# Patient Record
Sex: Female | Born: 1970 | Race: White | Hispanic: No | Smoking: Never smoker
Health system: Southern US, Community
[De-identification: ages and names within clinical notes are randomized; demographics above are authoritative.]

## PROBLEM LIST (undated history)

## (undated) DIAGNOSIS — J45909 Unspecified asthma, uncomplicated: Secondary | ICD-10-CM

## (undated) DIAGNOSIS — R112 Nausea with vomiting, unspecified: Secondary | ICD-10-CM

## (undated) DIAGNOSIS — Z9889 Other specified postprocedural states: Secondary | ICD-10-CM

## (undated) DIAGNOSIS — I10 Essential (primary) hypertension: Secondary | ICD-10-CM

## (undated) DIAGNOSIS — T4145XA Adverse effect of unspecified anesthetic, initial encounter: Secondary | ICD-10-CM

## (undated) DIAGNOSIS — J302 Other seasonal allergic rhinitis: Secondary | ICD-10-CM

## (undated) DIAGNOSIS — K219 Gastro-esophageal reflux disease without esophagitis: Secondary | ICD-10-CM

## (undated) DIAGNOSIS — E039 Hypothyroidism, unspecified: Secondary | ICD-10-CM

## (undated) DIAGNOSIS — E079 Disorder of thyroid, unspecified: Secondary | ICD-10-CM

## (undated) DIAGNOSIS — T8859XA Other complications of anesthesia, initial encounter: Secondary | ICD-10-CM

## (undated) HISTORY — DX: Other seasonal allergic rhinitis: J30.2

## (undated) HISTORY — PX: CHOLECYSTECTOMY: SHX55

## (undated) HISTORY — PX: TRANSTHORACIC ECHOCARDIOGRAM: SHX275

## (undated) HISTORY — DX: Unspecified asthma, uncomplicated: J45.909

## (undated) HISTORY — PX: TUBAL LIGATION: SHX77

## (undated) HISTORY — PX: CERVICAL CONE BIOPSY: SUR198

---

## 2000-12-28 ENCOUNTER — Ambulatory Visit (HOSPITAL_COMMUNITY): Admission: RE | Admit: 2000-12-28 | Discharge: 2000-12-28 | Payer: Self-pay | Admitting: Obstetrics and Gynecology

## 2001-07-18 ENCOUNTER — Emergency Department (HOSPITAL_COMMUNITY): Admission: EM | Admit: 2001-07-18 | Discharge: 2001-07-18 | Payer: Self-pay | Admitting: *Deleted

## 2002-01-30 ENCOUNTER — Encounter: Payer: Self-pay | Admitting: *Deleted

## 2002-01-30 ENCOUNTER — Emergency Department (HOSPITAL_COMMUNITY): Admission: EM | Admit: 2002-01-30 | Discharge: 2002-01-30 | Payer: Self-pay | Admitting: *Deleted

## 2003-02-18 ENCOUNTER — Encounter: Payer: Self-pay | Admitting: *Deleted

## 2003-02-18 ENCOUNTER — Emergency Department (HOSPITAL_COMMUNITY): Admission: EM | Admit: 2003-02-18 | Discharge: 2003-02-18 | Payer: Self-pay | Admitting: *Deleted

## 2003-02-18 ENCOUNTER — Encounter: Payer: Self-pay | Admitting: Family Medicine

## 2003-02-18 ENCOUNTER — Ambulatory Visit (HOSPITAL_COMMUNITY): Admission: RE | Admit: 2003-02-18 | Discharge: 2003-02-18 | Payer: Self-pay | Admitting: Family Medicine

## 2003-06-08 ENCOUNTER — Ambulatory Visit (HOSPITAL_COMMUNITY): Admission: RE | Admit: 2003-06-08 | Discharge: 2003-06-08 | Payer: Self-pay | Admitting: Family Medicine

## 2003-06-08 ENCOUNTER — Encounter: Payer: Self-pay | Admitting: Family Medicine

## 2004-08-22 ENCOUNTER — Ambulatory Visit (HOSPITAL_COMMUNITY): Admission: RE | Admit: 2004-08-22 | Discharge: 2004-08-22 | Payer: Self-pay | Admitting: Family Medicine

## 2004-09-22 ENCOUNTER — Emergency Department (HOSPITAL_COMMUNITY): Admission: EM | Admit: 2004-09-22 | Discharge: 2004-09-22 | Payer: Self-pay | Admitting: *Deleted

## 2005-01-10 ENCOUNTER — Other Ambulatory Visit: Admission: RE | Admit: 2005-01-10 | Discharge: 2005-01-10 | Payer: Self-pay | Admitting: Orthopaedic Surgery

## 2006-08-31 ENCOUNTER — Ambulatory Visit (HOSPITAL_COMMUNITY): Admission: RE | Admit: 2006-08-31 | Discharge: 2006-08-31 | Payer: Self-pay | Admitting: Family Medicine

## 2006-09-28 ENCOUNTER — Ambulatory Visit (HOSPITAL_COMMUNITY): Admission: RE | Admit: 2006-09-28 | Discharge: 2006-09-28 | Payer: Self-pay | Admitting: General Surgery

## 2006-09-28 ENCOUNTER — Encounter (INDEPENDENT_AMBULATORY_CARE_PROVIDER_SITE_OTHER): Payer: Self-pay | Admitting: Specialist

## 2008-01-06 ENCOUNTER — Emergency Department (HOSPITAL_COMMUNITY): Admission: EM | Admit: 2008-01-06 | Discharge: 2008-01-06 | Payer: Self-pay | Admitting: Emergency Medicine

## 2008-07-20 ENCOUNTER — Ambulatory Visit (HOSPITAL_COMMUNITY): Admission: RE | Admit: 2008-07-20 | Discharge: 2008-07-20 | Payer: Self-pay | Admitting: Cardiology

## 2008-07-20 ENCOUNTER — Encounter (INDEPENDENT_AMBULATORY_CARE_PROVIDER_SITE_OTHER): Payer: Self-pay | Admitting: Cardiology

## 2008-11-23 ENCOUNTER — Emergency Department (HOSPITAL_COMMUNITY): Admission: EM | Admit: 2008-11-23 | Discharge: 2008-11-23 | Payer: Self-pay | Admitting: Emergency Medicine

## 2010-10-31 ENCOUNTER — Emergency Department (HOSPITAL_COMMUNITY)
Admission: EM | Admit: 2010-10-31 | Discharge: 2010-11-01 | Disposition: A | Discharge status: Home / Self Care | Payer: BC Managed Care – PPO | Attending: Emergency Medicine | Admitting: Emergency Medicine

## 2010-10-31 ENCOUNTER — Emergency Department (HOSPITAL_COMMUNITY): Payer: BC Managed Care – PPO

## 2010-10-31 DIAGNOSIS — R209 Unspecified disturbances of skin sensation: Secondary | ICD-10-CM | POA: Insufficient documentation

## 2010-10-31 DIAGNOSIS — M79609 Pain in unspecified limb: Secondary | ICD-10-CM | POA: Insufficient documentation

## 2010-10-31 DIAGNOSIS — R079 Chest pain, unspecified: Secondary | ICD-10-CM | POA: Insufficient documentation

## 2010-10-31 LAB — CBC
Platelets: 235 10*3/uL (ref 150–400)
RBC: 3.85 MIL/uL — ABNORMAL LOW (ref 3.87–5.11)
RDW: 12.4 % (ref 11.5–15.5)
WBC: 6.8 10*3/uL (ref 4.0–10.5)

## 2010-10-31 LAB — DIFFERENTIAL
Basophils Absolute: 0 10*3/uL (ref 0.0–0.1)
Eosinophils Absolute: 0.3 10*3/uL (ref 0.0–0.7)
Eosinophils Relative: 5 % (ref 0–5)
Neutrophils Relative %: 65 % (ref 43–77)

## 2010-10-31 LAB — POCT CARDIAC MARKERS
CKMB, poc: <1 ng/mL — ABNORMAL LOW (ref 1.0–8.0)
CKMB, poc: <1 ng/mL — ABNORMAL LOW (ref 1.0–8.0)
Myoglobin, poc: 42.6 ng/mL (ref 12–200)
Troponin i, poc: <0.05 ng/mL (ref 0.00–0.09)

## 2010-10-31 LAB — BASIC METABOLIC PANEL
GFR calc Af Amer: >60 mL/min (ref >60)
GFR calc non Af Amer: >60 mL/min (ref >60)
Potassium: 3.4 mEq/L — ABNORMAL LOW (ref 3.5–5.1)
Sodium: 139 mEq/L (ref 135–145)

## 2010-10-31 LAB — D-DIMER, QUANTITATIVE: D-Dimer, Quant: 0.35 ug/mL-FEU (ref 0.00–0.48)

## 2010-12-08 LAB — BASIC METABOLIC PANEL
CO2: 25 mEq/L (ref 19–32)
Calcium: 8.3 mg/dL — ABNORMAL LOW (ref 8.4–10.5)
GFR calc Af Amer: >60 mL/min (ref >60)
GFR calc non Af Amer: >60 mL/min (ref >60)
Sodium: 135 mEq/L (ref 135–145)

## 2010-12-08 LAB — URINALYSIS, ROUTINE W REFLEX MICROSCOPIC
Ketones, ur: 15 mg/dL — AB
Specific Gravity, Urine: >1.03 — ABNORMAL HIGH (ref 1.005–1.030)
Urobilinogen, UA: 2 mg/dL — ABNORMAL HIGH (ref 0.0–1.0)

## 2010-12-08 LAB — URINE MICROSCOPIC-ADD ON

## 2011-01-13 NOTE — H&P (Signed)
NAMEJOHNATHON, MITTAL               ACCOUNT NO.:  [id number]   MEDICAL RECORD NO.:  [id number]          PATIENT TYPE:  AMB   LOCATION:  DAY                           FACILITY:  [facility]   PHYSICIAN:  Dalia Heading, M.D.  DATE OF BIRTH:  1970-11-17   DATE OF ADMISSION:  DATE OF DISCHARGE:  LH                              HISTORY & PHYSICAL   CHIEF COMPLAINT:  Chronic cholecystitis.   HISTORY OF PRESENT ILLNESS:  The patient is a 40 year old white female  who is referred for evaluation and treatment of biliary colic secondary  to chronic cholecystitis.  She has been having right upper quadrant  abdominal pain, nausea, bloating for several weeks.  She does have fatty  food intolerance.  No fever, chills or jaundice have been noted.   PAST MEDICAL HISTORY:  Includes hypothyroidism.   PAST SURGICAL HISTORY:  Tubal ligation, trigger finger release.   CURRENT MEDICATIONS:  Synthroid 75 mcg p.o. daily.   ALLERGIES:  No known drug allergies.   REVIEW OF SYSTEMS:  Noncontributory.   PHYSICAL EXAMINATION:  The patient is a well-developed, well-nourished  white female in no acute distress.  HEENT examination reveals no scleral  icterus.  Lungs are clear to auscultation with equal breath sounds  bilaterally.  Heart examination reveals a regular rate and rhythm  without S3, S4, murmurs.  The abdomen is soft and nondistended.  She is  tender in the right upper quadrant to palpation.  No hepatosplenomegaly,  masses or hernias are identified.  Ultrasound of the gallbladder is  negative.  HIDA scan reveals a normal ejection fraction but reproducible  symptoms with a fatty meal.   IMPRESSION:  Chronic cholecystitis.   PLAN:  The patient is scheduled for laparoscopic cholecystectomy on  September 28, 2006.  The risks and benefits of the procedure including  bleeding, infection, hepatobiliary injury and the possibility of an open  procedure were fully explained to the patient, gave informed  consent.      Dalia Heading, M.D.  Electronically Signed     MAJ/MEDQ  D:  09/25/2006  T:  09/25/2006  Job:  [id number]   cc:   [facility] Short Stay   Kirk Ruths, M.D.  Fax: [phone]

## 2011-01-13 NOTE — Op Note (Signed)
[name], [name]               ACCOUNT NO.:  [id number]   MEDICAL RECORD NO.:  [id number]          PATIENT TYPE:  AMB   LOCATION:  DAY                           FACILITY:  [facility]   PHYSICIAN:  Dalia Heading, M.D.  DATE OF BIRTH:  [birth year]   DATE OF PROCEDURE:  09/28/2006  DATE OF DISCHARGE:                               OPERATIVE REPORT   PREOPERATIVE DIAGNOSIS:  Chronic cholecystitis.   POSTOPERATIVE DIAGNOSIS:  Chronic cholecystitis.   PROCEDURE:  Laparoscopic cholecystectomy.   SURGEON:  Dalia Heading, M.D.   ANESTHESIA:  General endotracheal.   INDICATIONS:  The patient is a 40 year old white female who presents  with biliary colic secondary to chronic cholecystitis.  The risks and  benefits of the procedure, including bleeding, infection, hepatobiliary  injury, and the possibility of an open procedure were fully explained to  the patient, who gave informed consent.   PROCEDURE NOTE:  The patient was placed in the supine position.  After  induction of general endotracheal anesthesia, the abdomen was prepped  and draped in the usual sterile technique with Betadine.  Surgical site  confirmation was performed.   A supraumbilical incision was made down to the fascia.  A Veress needle  was introduced into the abdominal cavity, and confirmation of placement  was done using the saline drop test.  The abdomen was then insufflated  to 16 mmHg pressure.  An 11 mm trocar was introduced into the abdominal  cavity under direct visualization without difficulty.  The patient was  placed in reverse Trendelenburg position.  An additional 11 mm trocar  was placed in the epigastric region, and a 5 mm trocars placed in the  right upper quadrant and right flank regions.  The liver was inspected  and noted to be within normal limits.  The gallbladder was retracted  superiorly and laterally.  The dissection was begun around the  infundibulum of the gallbladder.  The cystic duct was  first identified.  Its juncture to the infundibulum was fully identified.  Endoclips were  placed proximally distally on the cystic duct, and the cystic duct was  divided.  This was likewise done to the cystic artery.  The gallbladder  was then freed away from the gallbladder fossa using Bovie  electrocautery.  The gallbladder was delivered through the epigastric  trocar site using an EndoCatch bag.  The gallbladder fossa was  inspected.  No abnormal bleeding or bile leakage was noted.  Surgicel  was placed in the gallbladder fossa.  All fluid and air were then  evacuate from the abdominal cavity prior to removal of the trocars.   All wounds were irrigated with normal saline.  All wounds were checked  with 0.5 cm Sensorcaine.  The supraumbilical fascia was reapproximated  using an 0 Vicryl interrupted suture.  All skin incisions were closed  using staples.  Betadine ointment and dry sterile dressings were  applied.   All tape and needle counts were correct at the end of the procedure.  The patient was extubated in the operating room and went back to the  recovery room  awake, in stable condition.   COMPLICATIONS:  None.   SPECIMEN:  Gallbladder.   BLOOD LOSS:  Minimal.      Dalia Heading, M.D.  Electronically Signed     MAJ/MEDQ  D:  09/28/2006  T:  09/28/2006  Job:  [id number]   cc:   Kirk Ruths, M.D.  Fax: [phone]

## 2011-03-30 ENCOUNTER — Emergency Department (HOSPITAL_COMMUNITY)
Admission: EM | Admit: 2011-03-30 | Discharge: 2011-03-30 | Discharge status: Against Medical Advice | Payer: BC Managed Care – PPO | Attending: Emergency Medicine | Admitting: Emergency Medicine

## 2011-03-30 DIAGNOSIS — Z5320 Procedure and treatment not carried out because of patient's decision for unspecified reasons: Secondary | ICD-10-CM | POA: Insufficient documentation

## 2011-03-30 NOTE — ED Notes (Signed)
Registration called and reports pt left before being seen

## 2011-05-24 LAB — POCT CARDIAC MARKERS
CKMB, poc: <1 — ABNORMAL LOW
Myoglobin, poc: 39.2

## 2011-12-24 ENCOUNTER — Emergency Department (HOSPITAL_COMMUNITY): Payer: BC Managed Care – PPO

## 2011-12-24 ENCOUNTER — Other Ambulatory Visit: Payer: Self-pay

## 2011-12-24 ENCOUNTER — Emergency Department (HOSPITAL_COMMUNITY)
Admission: EM | Admit: 2011-12-24 | Discharge: 2011-12-24 | Disposition: A | Discharge status: Home / Self Care | Payer: BC Managed Care – PPO | Attending: Emergency Medicine | Admitting: Emergency Medicine

## 2011-12-24 ENCOUNTER — Encounter (HOSPITAL_COMMUNITY): Payer: Self-pay | Admitting: *Deleted

## 2011-12-24 DIAGNOSIS — R079 Chest pain, unspecified: Secondary | ICD-10-CM | POA: Insufficient documentation

## 2011-12-24 DIAGNOSIS — M7989 Other specified soft tissue disorders: Secondary | ICD-10-CM | POA: Insufficient documentation

## 2011-12-24 DIAGNOSIS — E079 Disorder of thyroid, unspecified: Secondary | ICD-10-CM | POA: Insufficient documentation

## 2011-12-24 HISTORY — DX: Disorder of thyroid, unspecified: E07.9

## 2011-12-24 LAB — BASIC METABOLIC PANEL
Calcium: 9.4 mg/dL (ref 8.4–10.5)
GFR calc Af Amer: >90 mL/min (ref >90)
GFR calc non Af Amer: >90 mL/min (ref >90)
Sodium: 136 mEq/L (ref 135–145)

## 2011-12-24 LAB — CBC
MCH: 30.6 pg (ref 26.0–34.0)
MCHC: 34 g/dL (ref 30.0–36.0)
Platelets: 206 10*3/uL (ref 150–400)
RBC: 4.21 MIL/uL (ref 3.87–5.11)

## 2011-12-24 LAB — TROPONIN I: Troponin I: <0.3 ng/mL (ref <0.30)

## 2011-12-24 MED ORDER — METRONIDAZOLE 500 MG PO TABS: 500.0000 mg | ORAL_TABLET | Freq: Two times a day (BID) | ORAL | Status: DC

## 2011-12-24 MED ORDER — POTASSIUM CHLORIDE CRYS ER 20 MEQ PO TBCR: 20.0000 meq | EXTENDED_RELEASE_TABLET | Freq: Every day | ORAL | Status: DC

## 2011-12-24 NOTE — ED Notes (Signed)
Pt reports she returned from the beach this am and has noticed swelling of feet and hands starting yesterday, also reports some tingling in hands

## 2011-12-24 NOTE — ED Provider Notes (Addendum)
History     CSN: [id number]  Arrival date & time 12/24/11  1909   First MD Initiated Contact with Patient 12/24/11 1920      Chief Complaint  Patient presents with  . Joint Swelling    (Consider location/radiation/quality/duration/timing/severity/associated sxs/prior treatment) HPI Tammie Powell is a 41 y.o. female who presents to the Emergency Department complaining of  Swelling to her hands and feet since returning from the beach yesterday. Has taken tylenol only. Here due to urging of family members due to family history of early CAD and stroke. Denies fever, chills, nasuea, vomiting, cough, shortness of breath. Had intermittent chest pain today x 1.  Past Medical History  Diagnosis Date  . Thyroid disease     Past Surgical History  Procedure Date  . Cholecystectomy   . Tubal ligation     No family history on file.  History  Substance Use Topics  . Smoking status: Never Smoker   . Smokeless tobacco: Not on file  . Alcohol Use: No    OB History    Grav Para Term Preterm Abortions TAB SAB Ect Mult Living                  Review of Systems  Constitutional: Negative for fever.       10 Systems reviewed and are negative for acute change except as noted in the HPI.  HENT: Negative for congestion.   Eyes: Negative for discharge and redness.  Respiratory: Negative for cough and shortness of breath.   Cardiovascular: Negative for chest pain.  Gastrointestinal: Negative for vomiting and abdominal pain.  Musculoskeletal: Negative for back pain.       Hands and feet swelling  Skin: Negative for rash.  Neurological: Negative for syncope, numbness and headaches.  Psychiatric/Behavioral:       No behavior change.    Allergies  Codeine  Home Medications   Current Outpatient Rx  Name Route Sig Dispense Refill  . LEVOTHYROXINE SODIUM 88 MCG PO TABS Oral Take 88 mcg by mouth daily.      BP 142/94  Pulse 82  Temp(Src) 97.8 F (36.6 C) (Oral)  Resp 20  Ht 5'  4" (1.626 m)  Wt 215 lb (97.523 kg)  BMI 36.90 kg/m2  SpO2 100%  LMP 11/15/2011  Physical Exam  Nursing note and vitals reviewed. Constitutional:       Awake, alert, nontoxic appearance.  HENT:  Head: Atraumatic.  Eyes: Right eye exhibits no discharge. Left eye exhibits no discharge.  Neck: Neck supple.  Pulmonary/Chest: Effort normal. She exhibits no tenderness.  Abdominal: Soft. There is no tenderness. There is no rebound.  Musculoskeletal: She exhibits edema. She exhibits no tenderness.       Baseline ROM, no obvious new focal weakness. 2+ edema of feet. Swelling of hands without pitting.   Neurological:       Mental status and motor strength appears baseline for patient and situation.  Skin: No rash noted.  Psychiatric: She has a normal mood and affect.    ED Course  Procedures (including critical care time) Results for orders placed during the hospital encounter of 12/24/11  CBC      Component Value Range   WBC 7.4  4.0 - 10.5 (K/uL)   RBC 4.21  3.87 - 5.11 (MIL/uL)   Hemoglobin 12.9  12.0 - 15.0 (g/dL)   HCT 11.9  14.7 - 82.9 (%)   MCV 90.0  78.0 - 100.0 (fL)   MCH 30.6  26.0 - 34.0 (pg)   MCHC 34.0  30.0 - 36.0 (g/dL)   RDW 95.2  84.1 - 32.4 (%)   Platelets 206  150 - 400 (K/uL)  BASIC METABOLIC PANEL      Component Value Range   Sodium 136  135 - 145 (mEq/L)   Potassium 3.6  3.5 - 5.1 (mEq/L)   Chloride 103  96 - 112 (mEq/L)   CO2 27  19 - 32 (mEq/L)   Glucose, Bld 98  70 - 99 (mg/dL)   BUN 10  6 - 23 (mg/dL)   Creatinine, Ser 4.01  0.50 - 1.10 (mg/dL)   Calcium 9.4  8.4 - 02.7 (mg/dL)   GFR calc non Af Amer >90  >90 (mL/min)   GFR calc Af Amer >90  >90 (mL/min)  TROPONIN I      Component Value Range   Troponin I <0.30  <0.30 (ng/mL)  Dg Chest 2 View  12/24/2011  *RADIOLOGY REPORT*  Clinical Data: Chest pain, extremity swelling  CHEST - 2 VIEW  Comparison: 10/31/2010; 01/06/2008  Findings: Grossly unchanged borderline enlarged cardiac silhouette and  mediastinal contours. Grossly unchanged bibasilar opacities favored to represent atelectasis.  No new focal airspace opacities. No definite evidence of pulmonary edema.  No definite pleural effusion or pneumothorax.  Grossly unchanged bones.  IMPRESSION: Grossly unchanged borderline enlarged cardiac silhouette and bibasilar opacities favored to represent atelectasis. No definite evidence of pulmonary edema.  Original Report Authenticated By: Waynard Reeds, M.D.     Date: 12/24/2011  2003  Rate: 75  Rhythm: normal sinus rhythm and sinus arrhythmia  QRS Axis: normal  Intervals: normal  ST/T Wave abnormalities: normal  Conduction Disutrbances:none  Narrative Interpretation:   Old EKG Reviewed: none available    MDM  Patient here with swelling to hands and feet that she noticed when returning from the beach. She is concerned that this is related to her heart since she has a family history of early heart disease and stroke. Labs are normal, including a normal troponin. EKG is normal. Chest x-ray does not reveal any acute process. Patient has remained pain-free since arrival.Dx testing d/w pt .  Questions answered.  Verb understanding, agreeable to d/c home with outpt f/u.Pt stable in ED with no significant deterioration in condition.The patient appears reasonably screened and/or stabilized for discharge and I doubt any other medical condition or other [facility] requiring further screening, evaluation, or treatment in the ED at this time prior to discharge.  MDM Reviewed: nursing note and vitals Interpretation: labs, ECG and x-ray            Nicoletta Dress. Colon Branch, MD 12/24/11 2126  2[address] [facility] in [place] to leave a message regarding two prescriptions sent in error: Metronidazole, and potassium.  Nicoletta Dress. Colon Branch, MD 12/24/11 2131  Nicoletta Dress. Colon Branch, MD 01/16/12 [phone]

## 2011-12-24 NOTE — Discharge Instructions (Signed)
Your blood work here today was normal including her heart numbers. Your EKG was normal and your chest x-ray did not show any acute process. The swelling in your hands and feet will go down if you keep them elevated. Followup with your doctor.

## 2012-07-28 ENCOUNTER — Encounter (HOSPITAL_COMMUNITY): Payer: Self-pay | Admitting: Emergency Medicine

## 2012-07-28 ENCOUNTER — Emergency Department (HOSPITAL_COMMUNITY)
Admission: EM | Admit: 2012-07-28 | Discharge: 2012-07-28 | Disposition: A | Discharge status: Home / Self Care | Payer: BC Managed Care – PPO | Attending: Emergency Medicine | Admitting: Emergency Medicine

## 2012-07-28 ENCOUNTER — Emergency Department (HOSPITAL_COMMUNITY): Payer: BC Managed Care – PPO

## 2012-07-28 DIAGNOSIS — I10 Essential (primary) hypertension: Secondary | ICD-10-CM | POA: Insufficient documentation

## 2012-07-28 DIAGNOSIS — E079 Disorder of thyroid, unspecified: Secondary | ICD-10-CM | POA: Insufficient documentation

## 2012-07-28 DIAGNOSIS — Z79899 Other long term (current) drug therapy: Secondary | ICD-10-CM | POA: Insufficient documentation

## 2012-07-28 DIAGNOSIS — S20219A Contusion of unspecified front wall of thorax, initial encounter: Secondary | ICD-10-CM | POA: Insufficient documentation

## 2012-07-28 HISTORY — DX: Essential (primary) hypertension: I10

## 2012-07-28 MED ORDER — IBUPROFEN 600 MG PO TABS: 600.0000 mg | ORAL_TABLET | Freq: Four times a day (QID) | ORAL | Status: DC | PRN

## 2012-07-28 NOTE — ED Provider Notes (Signed)
Medical screening examination/treatment/procedure(s) were performed by non-physician practitioner and as supervising physician I was immediately available for consultation/collaboration.   Cedric Denison, MD 07/28/12 2349 
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
## 2012-09-01 ENCOUNTER — Encounter (HOSPITAL_COMMUNITY): Payer: Self-pay | Admitting: *Deleted

## 2012-09-01 ENCOUNTER — Emergency Department (HOSPITAL_COMMUNITY)
Admission: EM | Admit: 2012-09-01 | Discharge: 2012-09-01 | Disposition: A | Discharge status: Home / Self Care | Payer: BC Managed Care – PPO | Attending: Emergency Medicine | Admitting: Emergency Medicine

## 2012-09-01 DIAGNOSIS — F411 Generalized anxiety disorder: Secondary | ICD-10-CM | POA: Insufficient documentation

## 2012-09-01 DIAGNOSIS — F419 Anxiety disorder, unspecified: Secondary | ICD-10-CM

## 2012-09-01 DIAGNOSIS — G4700 Insomnia, unspecified: Secondary | ICD-10-CM | POA: Insufficient documentation

## 2012-09-01 DIAGNOSIS — Z79899 Other long term (current) drug therapy: Secondary | ICD-10-CM | POA: Insufficient documentation

## 2012-09-01 DIAGNOSIS — I10 Essential (primary) hypertension: Secondary | ICD-10-CM | POA: Insufficient documentation

## 2012-09-01 DIAGNOSIS — E079 Disorder of thyroid, unspecified: Secondary | ICD-10-CM | POA: Insufficient documentation

## 2012-09-01 MED ORDER — ZOLPIDEM TARTRATE 5 MG PO TABS: 5.0000 mg | ORAL_TABLET | Freq: Every evening | ORAL | Status: DC | PRN

## 2012-09-01 NOTE — ED Notes (Signed)
Patient was not in room for discharge instructions.

## 2012-09-01 NOTE — ED Notes (Signed)
Patient not in room for discharge instructions 

## 2012-09-01 NOTE — ED Notes (Signed)
Pt c/o depression since christmas that has gotten worse today. Pt states she has not slept in 2 days. Pt states she had a sibling that tried to kill herself christmas, has family drama, and her boyfriend moved out this week. Pt states things have become to overwhelming. Pt is tearful. Pt denies si/hi and states she never has but she knows she need some help coping.

## 2012-09-01 NOTE — ED Provider Notes (Signed)
History     CSN: [id number]  Arrival date & time 09/01/12  0350   First MD Initiated Contact with Patient 09/01/12 [phone]      Chief Complaint  Patient presents with  . Depression    (Consider location/radiation/quality/duration/timing/severity/associated sxs/prior treatment) HPI Tammie Powell is a 42 y.o. female who presents to the Emergency Department complaining of depression and insomnia since Christmas. Patient has not slept in two days. She has broken up with a boyfriend and a sibling tried to commit suicide on Christmas. She was at work tonight and became tearful and unable to stop crying. She does not have a psychiatric history and does not see a Veterinary surgeon. She denies SI, HI, AVH.   PCP Dr. Phillips Odor   Past Medical History  Diagnosis Date  . Thyroid disease   . Hypertension     Past Surgical History  Procedure Date  . Cholecystectomy   . Tubal ligation     History reviewed. No pertinent family history.  History  Substance Use Topics  . Smoking status: Never Smoker   . Smokeless tobacco: Not on file  . Alcohol Use: No    OB History    Grav Para Term Preterm Abortions TAB SAB Ect Mult Living                  Review of Systems  Constitutional: Negative for fever.       10 Systems reviewed and are negative for acute change except as noted in the HPI.  HENT: Negative for congestion.   Eyes: Negative for discharge and redness.  Respiratory: Negative for cough and shortness of breath.   Cardiovascular: Negative for chest pain.  Gastrointestinal: Negative for vomiting and abdominal pain.  Musculoskeletal: Negative for back pain.  Skin: Negative for rash.  Neurological: Negative for syncope, numbness and headaches.  Psychiatric/Behavioral:       Insomnia, feels overwhelmed by her family circumstances    Allergies  Codeine  Home Medications   Current Outpatient Rx  Name  Route  Sig  Dispense  Refill  . ALPRAZOLAM 0.5 MG PO TABS   Oral   Take 0.5 mg  by mouth daily as needed.         . IBUPROFEN 600 MG PO TABS   Oral   Take 1 tablet (600 mg total) by mouth every 6 (six) hours as needed for pain.   30 tablet   0   . LEVOTHYROXINE SODIUM 125 MCG PO TABS   Oral   Take 125 mcg by mouth daily.         Marland Kitchen LISINOPRIL 10 MG PO TABS   Oral   Take 10 mg by mouth daily.           BP 142/97  Pulse 85  Temp 97.8 F (36.6 C) (Oral)  Resp 18  Ht 5\' 4"  (1.626 m)  Wt 185 lb (83.915 kg)  BMI 31.76 kg/m2  SpO2 98%  LMP 08/25/2012  Physical Exam  Nursing note and vitals reviewed. Constitutional:       Awake, anxious, tearful  HENT:  Head: Atraumatic.  Eyes: Right eye exhibits no discharge. Left eye exhibits no discharge.  Neck: Neck supple.  Cardiovascular: Normal rate.   Pulmonary/Chest: Effort normal and breath sounds normal. She exhibits no tenderness.  Abdominal: Soft. There is no tenderness. There is no rebound.  Musculoskeletal: She exhibits no tenderness.       Baseline ROM, no obvious new focal weakness.  Neurological:  Mental status and motor strength appears baseline for patient and situation.  Skin: No rash noted.  Psychiatric: She has a normal mood and affect.       Patient is tearful. She denies SI, HI, AVH. She has not slept in two days. She feels anxious and overwhelmed by family drama.    ED Course  Procedures (including critical care time)     MDM  Patient here with reactive depression and insomnia. She denies SI, HI, AVH. She is not psychotic. She is not seeking hospitalization. She is looking for resources information to get help. Provided her with resource information. Pt stable in ED with no significant deterioration in condition.The patient appears reasonably screened and/or stabilized for discharge and I doubt any other medical condition or other [facility] requiring further screening, evaluation, or treatment in the ED at this time prior to discharge.  MDM Reviewed: nursing note and  vitals          Nicoletta Dress. Colon Branch, MD 09/01/12 [phone]

## 2012-09-13 ENCOUNTER — Emergency Department (HOSPITAL_COMMUNITY)
Admission: EM | Admit: 2012-09-13 | Discharge: 2012-09-13 | Disposition: A | Discharge status: Home / Self Care | Payer: BC Managed Care – PPO | Attending: Emergency Medicine | Admitting: Emergency Medicine

## 2012-09-13 ENCOUNTER — Encounter (HOSPITAL_COMMUNITY): Payer: Self-pay | Admitting: *Deleted

## 2012-09-13 DIAGNOSIS — E079 Disorder of thyroid, unspecified: Secondary | ICD-10-CM | POA: Insufficient documentation

## 2012-09-13 DIAGNOSIS — F329 Major depressive disorder, single episode, unspecified: Secondary | ICD-10-CM | POA: Insufficient documentation

## 2012-09-13 DIAGNOSIS — I10 Essential (primary) hypertension: Secondary | ICD-10-CM | POA: Insufficient documentation

## 2012-09-13 DIAGNOSIS — F3289 Other specified depressive episodes: Secondary | ICD-10-CM | POA: Insufficient documentation

## 2012-09-13 LAB — CBC WITH DIFFERENTIAL/PLATELET
Basophils Absolute: 0 10*3/uL (ref 0.0–0.1)
Lymphocytes Relative: 24 % (ref 12–46)
Lymphs Abs: 1.5 10*3/uL (ref 0.7–4.0)
Neutrophils Relative %: 67 % (ref 43–77)
Platelets: 182 10*3/uL (ref 150–400)
RBC: 3.82 MIL/uL — ABNORMAL LOW (ref 3.87–5.11)
RDW: 12.5 % (ref 11.5–15.5)
WBC: 6.3 10*3/uL (ref 4.0–10.5)

## 2012-09-13 LAB — RAPID URINE DRUG SCREEN, HOSP PERFORMED
Barbiturates: NOT DETECTED
Benzodiazepines: POSITIVE — AB
Cocaine: NOT DETECTED
Tetrahydrocannabinol: NOT DETECTED

## 2012-09-13 LAB — COMPREHENSIVE METABOLIC PANEL
ALT: 9 U/L (ref 0–35)
AST: 11 U/L (ref 0–37)
Alkaline Phosphatase: 54 U/L (ref 39–117)
CO2: 29 mEq/L (ref 19–32)
Calcium: 9 mg/dL (ref 8.4–10.5)
GFR calc Af Amer: >90 mL/min (ref >90)
GFR calc non Af Amer: 82 mL/min — ABNORMAL LOW (ref >90)
Glucose, Bld: 93 mg/dL (ref 70–99)
Potassium: 3.6 mEq/L (ref 3.5–5.1)
Sodium: 141 mEq/L (ref 135–145)
Total Protein: 6.2 g/dL (ref 6.0–8.3)

## 2012-09-13 MED ORDER — SERTRALINE HCL 50 MG PO TABS: ORAL_TABLET | ORAL | Status: DC

## 2012-09-13 NOTE — ED Notes (Addendum)
Pt crying in room, states that she came here to get help, pt states that she told the telepsych psychiatrist that and her plan for overdose , EDP made aware

## 2012-09-13 NOTE — ED Notes (Signed)
States she had decided to go home, states her cousin is coming to get her. Advised to seek outpatient services

## 2012-09-13 NOTE — ED Provider Notes (Signed)
History  This chart was scribed for Benny Lennert, MD by Erskine Emery, ED Scribe. This patient was seen in room APA15/APA15 and the patient's care was started at 17:22.   CSN: [id number]  Arrival date & time 09/13/12  1712   First MD Initiated Contact with Patient 09/13/12 1722      Chief Complaint  Patient presents with  . V70.1    (Consider location/radiation/quality/duration/timing/severity/associated sxs/prior Treatment) Tammie Powell is a 42 y.o. female brought in by ambulance, who presents to the Emergency Department complaining of depression for the past several weeks. Pt reports she has wanted to hurt herself lately, but not right now. She claims she has been having a rough couple of weeks; she has been working a lot and she's going through a bad break up with a man who is bipolar. She has been having trouble sleeping, despite taking her Ambien and sometimes struggles with anxiety, despite taking xanax (of which she took 3 today). She was seen here 12 days ago for the same complaint and was discharged home. She has never been seen at [facility] or a similar facility. Patient is a 42 y.o. female presenting with altered mental status. The history is provided by the patient. No language interpreter was used.  Altered Mental Status This is a recurrent problem. The current episode started more than 1 week ago. The problem occurs constantly. The problem has not changed since onset.Pertinent negatives include no chest pain, no abdominal pain, no headaches and no shortness of breath. The symptoms are aggravated by stress. Nothing relieves the symptoms. Treatments tried: [place] and xanax. The treatment provided no relief.  Dr. Lamar Blinks PA is the pt's PCP.  Past Medical History  Diagnosis Date  . Thyroid disease   . Hypertension     Past Surgical History  Procedure Date  . Cholecystectomy   . Tubal ligation     No family history on file.  History  Substance Use Topics  . Smoking  status: Never Smoker   . Smokeless tobacco: Not on file  . Alcohol Use: No    OB History    Grav Para Term Preterm Abortions TAB SAB Ect Mult Living                  Review of Systems  Constitutional: Negative for fatigue.  HENT: Negative for congestion, sinus pressure and ear discharge.   Eyes: Negative for discharge.  Respiratory: Negative for cough and shortness of breath.   Cardiovascular: Negative for chest pain.  Gastrointestinal: Negative for abdominal pain and diarrhea.  Genitourinary: Negative for frequency and hematuria.  Musculoskeletal: Negative for back pain.  Skin: Negative for rash.  Neurological: Negative for seizures and headaches.  Hematological: Negative.   Psychiatric/Behavioral: Positive for suicidal ideas and altered mental status. Negative for hallucinations. The patient is nervous/anxious.   All other systems reviewed and are negative.    Allergies  Codeine  Home Medications   Current Outpatient Rx  Name  Route  Sig  Dispense  Refill  . ALPRAZOLAM 0.5 MG PO TABS   Oral   Take 0.5 mg by mouth daily as needed.         . IBUPROFEN 600 MG PO TABS   Oral   Take 1 tablet (600 mg total) by mouth every 6 (six) hours as needed for pain.   30 tablet   0   . LEVOTHYROXINE SODIUM 125 MCG PO TABS   Oral   Take 125 mcg by mouth  daily.         Marland Kitchen LISINOPRIL 10 MG PO TABS   Oral   Take 10 mg by mouth daily.         Marland Kitchen ZOLPIDEM TARTRATE 5 MG PO TABS   Oral   Take 1 tablet (5 mg total) by mouth at bedtime as needed for sleep.   10 tablet   0     Triage Vitals: BP 153/108  Pulse 69  Temp 97.6 F (36.4 C) (Oral)  Resp 20  SpO2 99%  LMP 08/25/2012  Physical Exam  Nursing note and vitals reviewed. Constitutional: She is oriented to person, place, and time. She appears well-developed.  HENT:  Head: Normocephalic and atraumatic.  Eyes: Conjunctivae normal and EOM are normal. No scleral icterus.  Neck: Neck supple. No thyromegaly present.    Cardiovascular: Normal rate and regular rhythm.  Exam reveals no gallop and no friction rub.   No murmur heard. Pulmonary/Chest: No stridor. She has no wheezes. She has no rales. She exhibits no tenderness.  Abdominal: She exhibits no distension. There is no tenderness. There is no rebound.  Musculoskeletal: Normal range of motion. She exhibits no edema.  Lymphadenopathy:    She has no cervical adenopathy.  Neurological: She is oriented to person, place, and time. Coordination normal.  Skin: No rash noted. No erythema.  Psychiatric: Her behavior is normal. She exhibits a depressed mood. She expresses no suicidal ideation. She expresses no suicidal plans.    ED Course  Procedures (including critical care time) DIAGNOSTIC STUDIES: Oxygen Saturation is 99% on room air, normal by my interpretation.    COORDINATION OF CARE: 17:30--I evaluated the patient and we discussed a treatment plan including urinalysis, blood work, conversation with a psychiatrist, and possible follow up with [facility] to which the pt agreed.   20:40--Psychiatrist faxes recommendation that the pt should be discharged.  21:17--I spoke with the pt again. She is not suicidal, but very depressed. She is trying to decicde whether she wants outpatient or inpatient treatment.  21:35--Pt decided she wants to try outpatient therapy.   Labs Reviewed - No data to display No results found.   No diagnosis found.    MDM        The chart was scribed for me under my direct supervision.  I personally performed the history, physical, and medical decision making and all procedures in the evaluation of this patient.Benny Lennert, MD 09/13/12 2258

## 2012-09-13 NOTE — ED Notes (Signed)
MD at bedside. Zammit back in room with pt

## 2012-09-13 NOTE — ED Notes (Signed)
MD at bedside. 

## 2012-09-13 NOTE — ED Notes (Signed)
Took 3 xanax prior to arrival

## 2012-09-13 NOTE — ED Notes (Signed)
Depressed, wants some help

## 2012-10-28 ENCOUNTER — Other Ambulatory Visit (HOSPITAL_COMMUNITY): Payer: Self-pay | Admitting: Physician Assistant

## 2012-10-28 DIAGNOSIS — M549 Dorsalgia, unspecified: Secondary | ICD-10-CM

## 2012-10-31 ENCOUNTER — Ambulatory Visit (HOSPITAL_COMMUNITY): Admission: RE | Admit: 2012-10-31 | Payer: BC Managed Care – PPO | Source: Ambulatory Visit

## 2012-11-12 ENCOUNTER — Ambulatory Visit (INDEPENDENT_AMBULATORY_CARE_PROVIDER_SITE_OTHER): Payer: BC Managed Care – PPO | Admitting: Internal Medicine

## 2012-11-18 ENCOUNTER — Ambulatory Visit (HOSPITAL_COMMUNITY): Payer: BC Managed Care – PPO

## 2015-12-03 ENCOUNTER — Encounter (HOSPITAL_COMMUNITY): Payer: Self-pay | Admitting: *Deleted

## 2015-12-03 ENCOUNTER — Emergency Department (HOSPITAL_COMMUNITY)
Admission: EM | Admit: 2015-12-03 | Discharge: 2015-12-03 | Disposition: A | Discharge status: Home / Self Care | Payer: BLUE CROSS/BLUE SHIELD | Attending: Emergency Medicine | Admitting: Emergency Medicine

## 2015-12-03 DIAGNOSIS — Z7984 Long term (current) use of oral hypoglycemic drugs: Secondary | ICD-10-CM | POA: Diagnosis not present

## 2015-12-03 DIAGNOSIS — R51 Headache: Secondary | ICD-10-CM | POA: Diagnosis present

## 2015-12-03 DIAGNOSIS — H53149 Visual discomfort, unspecified: Secondary | ICD-10-CM | POA: Diagnosis not present

## 2015-12-03 DIAGNOSIS — R600 Localized edema: Secondary | ICD-10-CM | POA: Insufficient documentation

## 2015-12-03 DIAGNOSIS — I10 Essential (primary) hypertension: Secondary | ICD-10-CM | POA: Diagnosis not present

## 2015-12-03 DIAGNOSIS — R202 Paresthesia of skin: Secondary | ICD-10-CM | POA: Diagnosis not present

## 2015-12-03 DIAGNOSIS — R519 Headache, unspecified: Secondary | ICD-10-CM

## 2015-12-03 LAB — COMPREHENSIVE METABOLIC PANEL
ALT: 19 U/L (ref 14–54)
AST: 20 U/L (ref 15–41)
Albumin: 3.8 g/dL (ref 3.5–5.0)
Alkaline Phosphatase: 60 U/L (ref 38–126)
Anion gap: 7 (ref 5–15)
BILIRUBIN TOTAL: 0.5 mg/dL (ref 0.3–1.2)
BUN: 13 mg/dL (ref 6–20)
CO2: 26 mmol/L (ref 22–32)
CREATININE: 0.79 mg/dL (ref 0.44–1.00)
Calcium: 8.3 mg/dL — ABNORMAL LOW (ref 8.9–10.3)
Chloride: 102 mmol/L (ref 101–111)
Glucose, Bld: 97 mg/dL (ref 65–99)
POTASSIUM: 3.7 mmol/L (ref 3.5–5.1)
Sodium: 135 mmol/L (ref 135–145)
TOTAL PROTEIN: 6.7 g/dL (ref 6.5–8.1)

## 2015-12-03 LAB — CBC WITH DIFFERENTIAL/PLATELET
BASOS ABS: 0 10*3/uL (ref 0.0–0.1)
Basophils Relative: 1 %
Eosinophils Absolute: 0.3 10*3/uL (ref 0.0–0.7)
Eosinophils Relative: 3 %
HEMATOCRIT: 36.8 % (ref 36.0–46.0)
Hemoglobin: 12.6 g/dL (ref 12.0–15.0)
LYMPHS ABS: 1.9 10*3/uL (ref 0.7–4.0)
LYMPHS PCT: 23 %
MCH: 31.5 pg (ref 26.0–34.0)
MCHC: 34.2 g/dL (ref 30.0–36.0)
MCV: 92 fL (ref 78.0–100.0)
MONO ABS: 0.7 10*3/uL (ref 0.1–1.0)
MONOS PCT: 8 %
NEUTROS ABS: 5.6 10*3/uL (ref 1.7–7.7)
Neutrophils Relative %: 65 %
Platelets: 207 10*3/uL (ref 150–400)
RBC: 4 MIL/uL (ref 3.87–5.11)
RDW: 12.1 % (ref 11.5–15.5)
WBC: 8.5 10*3/uL (ref 4.0–10.5)

## 2015-12-03 MED ORDER — METOCLOPRAMIDE HCL 5 MG/ML IJ SOLN
10.0000 mg | Freq: Once | INTRAMUSCULAR | Status: AC
  Administered 2015-12-03: 10 mg via INTRAVENOUS
  Filled 2015-12-03: qty 2

## 2015-12-03 MED ORDER — KETOROLAC TROMETHAMINE 30 MG/ML IJ SOLN
30.0000 mg | Freq: Once | INTRAMUSCULAR | Status: AC
  Administered 2015-12-03: 30 mg via INTRAVENOUS
  Filled 2015-12-03: qty 1

## 2015-12-03 MED ORDER — DIPHENHYDRAMINE HCL 50 MG/ML IJ SOLN
25.0000 mg | Freq: Once | INTRAMUSCULAR | Status: AC
  Administered 2015-12-03: 25 mg via INTRAVENOUS
  Filled 2015-12-03: qty 1

## 2015-12-03 NOTE — ED Notes (Addendum)
Pt states she saw a NP at [facility] about 3 weeks ago  due to having headaches behind her left eye and in the back, left side of her head. Pt also c/o bilateral ankle swelling and numbness and tingling to bilateral hands. Pt had a head ct at [facility] 2 weeks ago and was told that the CT was fine. Pt states her NP at [facility] is supposed to be setting her up an appt with a neurologist but she hasn't heard back from them yet. Pt concerned because her mother had a stroke and has 2 aunts who had aneurysms.

## 2015-12-03 NOTE — Discharge Instructions (Signed)
Follow-up with your neurologist or family doctor

## 2015-12-03 NOTE — ED Provider Notes (Signed)
CSN: [id number]     Arrival date & time 12/03/15  2040 History  By signing my name below, I, [facility], attest that this documentation has been prepared under the direction and in the presence of Milton Ferguson, MD. Electronically Signed: Virgel Bouquet, ED Scribe. 12/03/2015. 9:58 PM.   Chief Complaint  Patient presents with  . Headache   HPI Comments: Tammie Powell is a 45 y.o. female who presents to the Emergency Department complaining of intermittent HAs behind her left eye and left-side of the back of her head. Pt reports that she has was seen at [facility] 3 weeks ago for a similar HA and at [facility] 2 weeks ago where she received a normal head CT scan. She endorses associated photophobia. Pain worse with exposure to light. She has taken indomethacin without relief. Denies any other symptoms currently.   Patient is a 45 y.o. female presenting with headaches. The history is provided by the patient. No language interpreter was used.  Headache Pain location:  L parietal and frontal Quality:  Sharp Radiates to:  Does not radiate Severity currently:  8/10 Severity at highest:  8/10 Onset quality:  Gradual Timing:  Intermittent Progression:  Unchanged Chronicity:  Recurrent Similar to prior headaches: yes   Worsened by:  Light Ineffective treatments:  Prescription medications Associated symptoms: photophobia   Associated symptoms: no abdominal pain, no back pain, no congestion, no cough, no diarrhea, no fatigue, no seizures and no sinus pressure     Past Medical History  Diagnosis Date  . Thyroid disease   . Hypertension    Past Surgical History  Procedure Laterality Date  . Cholecystectomy    . Tubal ligation     No family history on file. Social History  Substance Use Topics  . Smoking status: Never Smoker   . Smokeless tobacco: None  . Alcohol Use: No   OB History    No data available     Review of Systems  Constitutional: Negative for appetite  change and fatigue.  HENT: Negative for congestion, ear discharge and sinus pressure.   Eyes: Positive for photophobia. Negative for discharge.  Respiratory: Negative for cough.   Cardiovascular: Positive for leg swelling (bilateral ankles). Negative for chest pain.  Gastrointestinal: Negative for abdominal pain and diarrhea.  Genitourinary: Negative for frequency and hematuria.  Musculoskeletal: Negative for back pain.  Skin: Negative for rash.  Neurological: Positive for headaches. Negative for seizures.  Psychiatric/Behavioral: Negative for hallucinations.    Allergies  Codeine  Home Medications   Prior to Admission medications   Medication Sig Start Date End Date Taking? Authorizing Provider  levothyroxine (SYNTHROID, LEVOTHROID) 125 MCG tablet Take 125 mcg by mouth every morning.     Historical Provider, MD   BP 174/87 mmHg  Pulse 65  Temp(Src) 98.2 F (36.8 C) (Oral)  Resp 16  Ht 5\' 5"  (1.651 m)  Wt 215 lb (97.523 kg)  BMI 35.78 kg/m2  SpO2 100%  LMP 11/21/2015 Physical Exam  Constitutional: She is oriented to person, place, and time. She appears well-developed.  HENT:  Head: Normocephalic.  Eyes: Conjunctivae and EOM are normal. No scleral icterus.  Neck: Neck supple. No thyromegaly present.  Cardiovascular: Normal rate and regular rhythm.  Exam reveals no gallop and no friction rub.   No murmur heard. Pulmonary/Chest: No stridor. She has no wheezes. She has no rales. She exhibits no tenderness.  Abdominal: She exhibits no distension. There is no tenderness. There is no rebound.  Musculoskeletal:  Normal range of motion. She exhibits edema.  1+ edema in bilateral ankles  Lymphadenopathy:    She has no cervical adenopathy.  Neurological: She is oriented to person, place, and time. She exhibits normal muscle tone. Coordination normal.  Skin: No rash noted. No erythema.  Psychiatric: She has a normal mood and affect. Her behavior is normal.  Nursing note and vitals  reviewed.   ED Course  Procedures   DIAGNOSTIC STUDIES: Oxygen Saturation is 100% on RA, normnal by my interpretation.    COORDINATION OF CARE: 9:20 PM Will order labs, Toradol, Reglan, and Benadryl. Discussed treatment plan with pt at bedside and pt agreed to plan.  Labs Review Labs Reviewed  CBC WITH DIFFERENTIAL/PLATELET  COMPREHENSIVE METABOLIC PANEL    Imaging Review No results found. I have personally reviewed and evaluated these images and lab results as part of my medical decision-making.   EKG Interpretation None      MDM   Final diagnoses:  None    Labs unremarkable. Patient improved with migraine cocktail. She is to follow-up with her family doctor or the neurologist she's been referred to. Patient does not want a prescription medicines.j    The chart was scribed for me under my direct supervision.  I personally performed the history, physical, and medical decision making and all procedures in the evaluation of this patient.Milton Ferguson, MD 12/03/15 [phone]

## 2015-12-03 NOTE — ED Notes (Signed)
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

## 2016-01-12 ENCOUNTER — Encounter: Payer: Self-pay | Admitting: Neurology

## 2016-01-12 ENCOUNTER — Ambulatory Visit (INDEPENDENT_AMBULATORY_CARE_PROVIDER_SITE_OTHER): Payer: BLUE CROSS/BLUE SHIELD | Admitting: Neurology

## 2016-01-12 VITALS — BP 132/96 | HR 78 | Ht 65.0 in | Wt 215.0 lb

## 2016-01-12 DIAGNOSIS — G44039 Episodic paroxysmal hemicrania, not intractable: Secondary | ICD-10-CM | POA: Diagnosis not present

## 2016-01-12 DIAGNOSIS — G5603 Carpal tunnel syndrome, bilateral upper limbs: Secondary | ICD-10-CM

## 2016-01-12 MED ORDER — TOPIRAMATE 50 MG PO TABS: ORAL_TABLET | ORAL | Status: DC

## 2016-01-12 NOTE — Progress Notes (Signed)
NEUROLOGY CONSULTATION NOTE  ACASIA HELM MRN: [id number] DOB: Mar 26, 1971  Referring provider: Dr. Gerarda Fraction Primary care provider: Dr. Hilma Favors  Reason for consult:  headache  HISTORY OF PRESENT ILLNESS: Tammie Powell is a 45 year old right-handed woman with hypertension and thyroid disease who presents for headache.  Labs and CT head imaging from 11/17/15 personally reviewed.  Onset:  3-4 months ago Location:  Left sided (left retro-orbital and left occipital) Quality:  stabbing Intensity:  7/10.  She woke up twice with the headache. Aura:  no Prodrome:  no Associated symptoms:  Left eye lacrimation and change in iris color.  Once in a while, she notes dizziness.  No nausea, photophobia, phonophobia or visual disturbance. Duration:  30 to 60 minutes Frequency:  2 to 3 times a week. Triggers/exacerbating factors:  no Relieving factors:  Laying down, sleep Activity:  Lays down when able  CT of head from 11/17/15 was unremarkable.  It revealed no mass lesion or aneurysm.  CBC and CMP from 12/03/15 were unremarkable.  Past NSAIDS:  Ibuprofen 400mg  Past analgesics:  no Past abortive triptans:  no Past muscle relaxants:  no Past anti-emetic:  no Past anti-anxiolytic:  no Past sleep aide:  no Past antihypertensive medications:  no Past antidepressant medications:  no Past anticonvulsant medications:  no  Current NSAIDS:  Indomethacin as needed Current analgesics:  acetaminophen Current triptans:  no Current anti-emetic:  no Current muscle relaxants:  no Current anti-anxiolytic:  no Current sleep aide:  no Current Antihypertensive medications:  Lisinopril-HCTZ Current Antidepressant medications:  no Current Anticonvulsant medications:  no Current Vitamins/Herbal/Supplements:  no Current Antihistamines/Decongestants:  no Other therapy:  no  Caffeine:  1 Mt. Dew Alcohol:  occasional Smoker:  no Diet:  hydrates Exercise:  yes Depression/stress:  no Sleep hygiene:   Good Personal history of headache:  Had 2 migraines in college Family history of headache:  Sister has migraines.   Family history of other neurologic conditions:  Maternal aunt has aneurysm and seizures.  For the past month, she also reports intermittent bilateral hand numbness.  She is a Designer, fashion/clothing and uses tools often.  She notices the numbness when pumping gas or holding her phone to her ear.  She sometimes wakes up with it and has to shake it out.  She denies weakness.  PAST MEDICAL HISTORY: Past Medical History  Diagnosis Date  . Thyroid disease   . Hypertension     PAST SURGICAL HISTORY: Past Surgical History  Procedure Laterality Date  . Cholecystectomy    . Tubal ligation      MEDICATIONS: Current Outpatient Prescriptions on File Prior to Visit  Medication Sig Dispense Refill  . INDOMETHACIN PO Take 1 capsule by mouth daily as needed (for pain).    Marland Kitchen levothyroxine (SYNTHROID, LEVOTHROID) 125 MCG tablet Take 125 mcg by mouth every morning.     Marland Kitchen lisinopril-hydrochlorothiazide (PRINZIDE,ZESTORETIC) 20-25 MG tablet Take 1 tablet by mouth daily.    . metFORMIN (GLUCOPHAGE) 500 MG tablet Take 500 mg by mouth 2 (two) times daily with a meal.     No current facility-administered medications on file prior to visit.    ALLERGIES: Allergies  Allergen Reactions  . Codeine Hives, Itching and Other (See Comments)    burning    FAMILY HISTORY: Family History  Problem Relation Age of Onset  . Stroke Mother   . Seizures Maternal Aunt   . Aneurysm Maternal Aunt     SOCIAL HISTORY: Social History   Social  History  . Marital Status: Legally Separated    Spouse Name: N/A  . Number of Children: N/A  . Years of Education: N/A   Occupational History  . Not on file.   Social History Main Topics  . Smoking status: Never Smoker   . Smokeless tobacco: Not on file  . Alcohol Use: No  . Drug Use: No  . Sexual Activity: Not on file   Other Topics Concern  . Not on file    Social History Narrative    REVIEW OF SYSTEMS: Constitutional: No fevers, chills, or sweats, no generalized fatigue, change in appetite Eyes: No visual changes, double vision, eye pain Ear, nose and throat: No hearing loss, ear pain, nasal congestion, sore throat Cardiovascular: No chest pain, palpitations Respiratory:  No shortness of breath at rest or with exertion, wheezes GastrointestinaI: No nausea, vomiting, diarrhea, abdominal pain, fecal incontinence Genitourinary:  No dysuria, urinary retention or frequency Musculoskeletal:  No neck pain, back pain Integumentary: No rash, pruritus, skin lesions Neurological: as above Psychiatric: No depression, insomnia, anxiety Endocrine: No palpitations, fatigue, diaphoresis, mood swings, change in appetite, change in weight, increased thirst Hematologic/Lymphatic:  No purpura, petechiae. Allergic/Immunologic: no itchy/runny eyes, nasal congestion, recent allergic reactions, rashes  PHYSICAL EXAM: Filed Vitals:   01/12/16 1054  BP: 132/96  Pulse: 78   General: No acute distress.  Patient appears well-groomed.  Head:  Normocephalic/atraumatic Eyes:  fundi examined but not visualized Neck: supple, no paraspinal tenderness, full range of motion Back: No paraspinal tenderness Heart: regular rate and rhythm Lungs: Clear to auscultation bilaterally. Vascular: No carotid bruits. Neurological Exam: Mental status: alert and oriented to person, place, and time, recent and remote memory intact, fund of knowledge intact, attention and concentration intact, speech fluent and not dysarthric, language intact. Cranial nerves: CN I: not tested CN II: pupils equal, round and reactive to light, visual fields intact CN III, IV, VI:  full range of motion, no nystagmus, no ptosis CN V: facial sensation intact CN VII: upper and lower face symmetric CN VIII: hearing intact CN IX, X: gag intact, uvula midline CN XI: sternocleidomastoid and trapezius  muscles intact CN XII: tongue midline Bulk & Tone: normal, no fasciculations. Motor:  5/5 throughout Sensation: temperature and vibration sensation intact. Deep Tendon Reflexes:  2+ throughout, toes downgoing.  Finger to nose testing:  Without dysmetria.  Heel to shin:  Without dysmetria.  Gait:  Normal station and stride.  Able to turn and tandem walk. Romberg negative.  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  Will initiate topiramate 50mg  at bedtime for 1 week, increasing to 50mg  twice daily.  She will contact us in 4 weeks with update and we can increase dose further if needed. 2.  Indomethacin as needed 3.  Bilateral wrist splints.  If not effective, then will get NCV-EMG 4.  Follow up in 3 to 4 months  Thank you for allowing me to take part in the care of this patient.  Metta Clines, DO  CC:  Redmond School, MD  Sharilyn Sites, MD

## 2016-01-12 NOTE — Patient Instructions (Addendum)
You probably have paroxysmal hemicrania 1. We will start topiramate (Topamax) 50mg  tablets.      Morning Evening Week 1:   none  1 tab Week 2:    1 tab  1 tab  CALL IN 4 WEEKS WITH UPDATE   Possible side effects include: impaired thinking, sedation, paresthesias (numbness and tingling) and weight loss.  It may cause dehydration and there is a small risk for kidney stones, so make sure to stay hydrated with water during the day.  There is also a very small risk for glaucoma, so if you notice any change in your vision while taking this medication, see an ophthalmologist.    2.  Continue indomethacin as needed. 3.  I think the hand numbness is carpal tunnel syndrome.  Go to your local medical supply store and pick up fitted wrist splints to wear at night.  If not improved by next visit, we can order a nerve test. 4.  Follow up in 3 to 4 months.

## 2016-01-12 NOTE — Progress Notes (Signed)
Chart forwarded.  

## 2016-04-14 ENCOUNTER — Ambulatory Visit: Payer: BLUE CROSS/BLUE SHIELD | Admitting: Neurology

## 2016-04-14 DIAGNOSIS — Z029 Encounter for administrative examinations, unspecified: Secondary | ICD-10-CM

## 2016-05-10 ENCOUNTER — Other Ambulatory Visit: Payer: Self-pay

## 2016-05-10 MED ORDER — TOPIRAMATE 50 MG PO TABS: 50.0000 mg | ORAL_TABLET | Freq: Two times a day (BID) | ORAL | 3 refills | Status: DC

## 2016-08-24 ENCOUNTER — Encounter: Payer: Self-pay | Admitting: Neurology

## 2016-08-24 ENCOUNTER — Ambulatory Visit (INDEPENDENT_AMBULATORY_CARE_PROVIDER_SITE_OTHER): Payer: BLUE CROSS/BLUE SHIELD | Admitting: Neurology

## 2016-08-24 VITALS — BP 144/98 | HR 63 | Ht 65.0 in | Wt 225.0 lb

## 2016-08-24 DIAGNOSIS — R200 Anesthesia of skin: Secondary | ICD-10-CM

## 2016-08-24 DIAGNOSIS — G44039 Episodic paroxysmal hemicrania, not intractable: Secondary | ICD-10-CM

## 2016-08-24 DIAGNOSIS — R2681 Unsteadiness on feet: Secondary | ICD-10-CM | POA: Diagnosis not present

## 2016-08-24 MED ORDER — TOPIRAMATE 50 MG PO TABS: ORAL_TABLET | ORAL | 3 refills | Status: DC

## 2016-08-24 NOTE — Patient Instructions (Addendum)
1.  We will increase topiramate 50mg  tablets to 1 tablet in morning and 2 tablets at bedtime.  Contact me in 6 weeks with update and we can adjust dose if needed. 2.  We will check MRI of brain with and without contrast. We have sent a referral to [place] for your MRI and they will call you directly to schedule your appt. They are located at [place]. If you need to contact them directly please call [phone]. 3.  Follow up in 4 months.

## 2016-08-24 NOTE — Progress Notes (Signed)
Note sent to Dr. Hilma Favors.

## 2016-08-24 NOTE — Progress Notes (Signed)
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  UPDATE: Overall, headaches are slightly improved. Intensity:  7/10 in left eye Duration:  30 to 60 minutes Frequency:  2-3 times a week Current NSAIDS:  Indomethacin as needed Current analgesics:  acetaminophen Current triptans:  no Current anti-emetic:  no Current muscle relaxants:  no Current anti-anxiolytic:  no Current sleep aide:  no Current Antihypertensive medications:  Lisinopril-HCTZ Current Antidepressant medications:  no Current Anticonvulsant medications:  topiramate 50mg  twice daily Current Vitamins/Herbal/Supplements:  no Current Antihistamines/Decongestants:  no Other therapy:  no   Since November, she has had two new issues.  She reports intermittent numbness and burning sensation of the left side of her body, including face, arm and leg.  It lasts 10 to 15 minutes and occurs a couple of times a day.  It is not positional and nothing triggers it.  It is not associated with her headaches.  She feels some heaviness in the arm and leg as well.  Around the same time, she reports problems with balance.  It is intermittent.  Sometimes she feels unsteady on her feet.  It is not associated with spinning sensation or abnormal sensation of movement in her head.  She feels fine when sitting.  It occurs daily.  HISTORY: Onset:  Early 2017 Location:  Left sided (left retro-orbital and left occipital) Quality:  stabbing Initial Intensity:  7/10.  She woke up twice with the headache. Aura:  no Prodrome:  no Associated symptoms:  Left eye lacrimation and change in iris color.  Once in a while, she notes dizziness.  No nausea, photophobia, phonophobia or visual disturbance. Initial Duration:  30 to 60 minutes Initial Frequency:  2 to 3 times a  week. Triggers/exacerbating factors:  no Relieving factors:  Laying down, sleep Activity:  Lays down when able   CT of head from 11/17/15 was unremarkable.  It revealed no mass lesion or aneurysm.  CBC and CMP from 12/03/15 were unremarkable.   Past NSAIDS:  Ibuprofen 400mg  Past analgesics:  no Past abortive triptans:  no Past muscle relaxants:  no Past anti-emetic:  no Past anti-anxiolytic:  no Past sleep aide:  no Past antihypertensive medications:  no Past antidepressant medications:  no Past anticonvulsant medications:  no    Caffeine:  1 Mt. Dew Alcohol:  occasional Smoker:  no Diet:  hydrates Exercise:  yes Depression/stress:  no Sleep hygiene:  Good Personal history of headache:  Had 2 migraines in college Family history of headache:  Sister has migraines.   Family history of other neurologic conditions:  Maternal aunt has aneurysm and seizures.  PAST MEDICAL HISTORY: Past Medical History:  Diagnosis Date  . Hypertension   . Thyroid disease     MEDICATIONS: Current Outpatient Prescriptions on File Prior to Visit  Medication Sig Dispense Refill  . levothyroxine (SYNTHROID, LEVOTHROID) 125 MCG tablet Take 125 mcg by mouth every morning.     Marland Kitchen lisinopril-hydrochlorothiazide (PRINZIDE,ZESTORETIC) 20-25 MG tablet Take 1 tablet by mouth daily.    . metFORMIN (GLUCOPHAGE) 500 MG tablet Take 500 mg by mouth 2 (two) times daily with a meal.     No current facility-administered medications on file prior to visit.     ALLERGIES: Allergies  Allergen Reactions  . Codeine Hives, Itching and Other (See Comments)    burning  FAMILY HISTORY: Family History  Problem Relation Age of Onset  . Stroke Mother   . Seizures Maternal Aunt   . Aneurysm Maternal Aunt     SOCIAL HISTORY: Social History   Social History  . Marital status: Legally Separated    Spouse name: N/A  . Number of children: N/A  . Years of education: N/A   Occupational History  . Not on file.    Social History Main Topics  . Smoking status: Never Smoker  . Smokeless tobacco: Not on file  . Alcohol use No  . Drug use: No  . Sexual activity: Not on file   Other Topics Concern  . Not on file   Social History Narrative  . No narrative on file    REVIEW OF SYSTEMS: Constitutional: No fevers, chills, or sweats, no generalized fatigue, change in appetite Eyes: No visual changes, double vision, eye pain Ear, nose and throat: No hearing loss, ear pain, nasal congestion, sore throat Cardiovascular: No chest pain, palpitations Respiratory:  No shortness of breath at rest or with exertion, wheezes GastrointestinaI: No nausea, vomiting, diarrhea, abdominal pain, fecal incontinence Genitourinary:  No dysuria, urinary retention or frequency Musculoskeletal:  No neck pain, back pain Integumentary: No rash, pruritus, skin lesions Neurological: as above Psychiatric: No depression, insomnia, anxiety Endocrine: No palpitations, fatigue, diaphoresis, mood swings, change in appetite, change in weight, increased thirst Hematologic/Lymphatic:  No purpura, petechiae. Allergic/Immunologic: no itchy/runny eyes, nasal congestion, recent allergic reactions, rashes  PHYSICAL EXAM: Vitals:   08/24/16 0741  BP: (!) 144/98  Pulse: 63   General: No acute distress.  Patient appears well-groomed.   Head:  Normocephalic/atraumatic Eyes:  Fundi examined but not visualized Neck: supple, no paraspinal tenderness, full range of motion Heart:  Regular rate and rhythm Lungs:  Clear to auscultation bilaterally Back: No paraspinal tenderness Neurological Exam: alert and oriented to person, place, and time. Attention span and concentration intact, recent and remote memory intact, fund of knowledge intact.  Speech fluent and not dysarthric, language intact.  CN II-XII intact. Bulk and tone normal, muscle strength 5/5 throughout.  Sensation to light touch, temperature and vibration intact.  Deep tendon  reflexes 2+ throughout, toes downgoing.  Finger to nose and heel to shin testing intact.  Gait normal, Romberg negative.  IMPRESSION: Paroxysmal hemicrania New-onset left sided numbness and gait instability, unclear etiology.  Exam is normal.  PLAN: 1.  Will increase topiramate to 50mg  in AM and 100mg  at night.  She will contact me in 6 weeks with update. 2.  Check MRI of brain with and without contrast to evaluate for intracranial etiology of left sided numbness and gait instability. 3.  Follow up in 4 months.   Metta Clines, DO  CC:  Sharilyn Sites, MD

## 2016-09-04 ENCOUNTER — Inpatient Hospital Stay: Admission: RE | Admit: 2016-09-04 | Payer: BLUE CROSS/BLUE SHIELD | Source: Ambulatory Visit

## 2016-09-12 ENCOUNTER — Ambulatory Visit
Admission: RE | Admit: 2016-09-12 | Discharge: 2016-09-12 | Disposition: A | Discharge status: Home / Self Care | Payer: BLUE CROSS/BLUE SHIELD | Source: Ambulatory Visit | Attending: Neurology | Admitting: Neurology

## 2016-09-12 DIAGNOSIS — R2681 Unsteadiness on feet: Secondary | ICD-10-CM

## 2016-09-12 DIAGNOSIS — R200 Anesthesia of skin: Secondary | ICD-10-CM

## 2016-09-12 DIAGNOSIS — G44039 Episodic paroxysmal hemicrania, not intractable: Secondary | ICD-10-CM

## 2016-09-12 MED ORDER — GADOBENATE DIMEGLUMINE 529 MG/ML IV SOLN
20.0000 mL | Freq: Once | INTRAVENOUS | Status: AC | PRN
  Administered 2016-09-12: 20 mL via INTRAVENOUS

## 2016-09-19 ENCOUNTER — Telehealth: Payer: Self-pay

## 2016-09-19 NOTE — Telephone Encounter (Signed)
Called patient.  No answer.

## 2016-09-19 NOTE — Telephone Encounter (Signed)
-----   Message from Pieter Partridge, DO sent at 09/17/2016  9:55 AM EST ----- MRI of brain is normal.  There is no explanation for her intermittent left sided numbness and unsteady gait.

## 2016-09-21 NOTE — Telephone Encounter (Signed)
Spoke to patient. Gave results. Patient verbalized understanding. 

## 2017-05-01 ENCOUNTER — Other Ambulatory Visit: Payer: Self-pay

## 2017-05-01 ENCOUNTER — Encounter (HOSPITAL_COMMUNITY): Payer: Self-pay | Admitting: *Deleted

## 2017-05-01 ENCOUNTER — Emergency Department (HOSPITAL_COMMUNITY)
Admission: EM | Admit: 2017-05-01 | Discharge: 2017-05-02 | Disposition: A | Discharge status: Home / Self Care | Payer: BLUE CROSS/BLUE SHIELD | Attending: Emergency Medicine | Admitting: Emergency Medicine

## 2017-05-01 ENCOUNTER — Emergency Department (HOSPITAL_COMMUNITY): Payer: BLUE CROSS/BLUE SHIELD

## 2017-05-01 DIAGNOSIS — R079 Chest pain, unspecified: Secondary | ICD-10-CM

## 2017-05-01 DIAGNOSIS — Z79899 Other long term (current) drug therapy: Secondary | ICD-10-CM | POA: Insufficient documentation

## 2017-05-01 DIAGNOSIS — R110 Nausea: Secondary | ICD-10-CM | POA: Diagnosis not present

## 2017-05-01 DIAGNOSIS — I10 Essential (primary) hypertension: Secondary | ICD-10-CM | POA: Diagnosis not present

## 2017-05-01 LAB — BASIC METABOLIC PANEL
Anion gap: 6 (ref 5–15)
BUN: 14 mg/dL (ref 6–20)
CHLORIDE: 103 mmol/L (ref 101–111)
CO2: 26 mmol/L (ref 22–32)
Calcium: 8.7 mg/dL — ABNORMAL LOW (ref 8.9–10.3)
Creatinine, Ser: 0.98 mg/dL (ref 0.44–1.00)
GFR calc Af Amer: >60 mL/min (ref >60)
GFR calc non Af Amer: >60 mL/min (ref >60)
GLUCOSE: 99 mg/dL (ref 65–99)
Potassium: 3.9 mmol/L (ref 3.5–5.1)
Sodium: 135 mmol/L (ref 135–145)

## 2017-05-01 LAB — CBC
HEMATOCRIT: 39.1 % (ref 36.0–46.0)
Hemoglobin: 12.9 g/dL (ref 12.0–15.0)
MCH: 31.5 pg (ref 26.0–34.0)
MCHC: 33 g/dL (ref 30.0–36.0)
MCV: 95.4 fL (ref 78.0–100.0)
Platelets: 237 10*3/uL (ref 150–400)
RBC: 4.1 MIL/uL (ref 3.87–5.11)
RDW: 12.4 % (ref 11.5–15.5)
WBC: 8.7 10*3/uL (ref 4.0–10.5)

## 2017-05-01 LAB — TROPONIN I
Troponin I: <0.03 ng/mL (ref <0.03)
Troponin I: <0.03 ng/mL (ref <0.03)

## 2017-05-01 MED ORDER — SODIUM CHLORIDE 0.9 % IV BOLUS (SEPSIS): 1000.0000 mL | Freq: Once | INTRAVENOUS | Status: DC

## 2017-05-01 MED ORDER — ONDANSETRON HCL 4 MG/2ML IJ SOLN: 4.0000 mg | Freq: Once | INTRAMUSCULAR | Status: DC

## 2017-05-01 NOTE — ED Triage Notes (Signed)
Pt comes in with left chest pain starting at 1700 that moves down her left arm. States she has family hx of cardiac problems. Pt tearful in triage.

## 2017-05-02 LAB — TSH: TSH: 2.915 u[IU]/mL (ref 0.350–4.500)

## 2017-05-02 MED ORDER — ONDANSETRON 8 MG PO TBDP
8.0000 mg | ORAL_TABLET | Freq: Once | ORAL | Status: AC
  Administered 2017-05-02: 8 mg via ORAL
  Filled 2017-05-02: qty 1

## 2017-05-02 MED ORDER — ONDANSETRON HCL 4 MG PO TABS: 4.0000 mg | ORAL_TABLET | Freq: Three times a day (TID) | ORAL | 0 refills | Status: DC | PRN

## 2017-05-02 NOTE — ED Provider Notes (Signed)
[facility] DEPT Provider Note   CSN: [id number] Arrival date & time: 05/01/17  1827     History   Chief Complaint Chief Complaint  Patient presents with  . Chest Pain    HPI Tammie Powell is a 46 y.o. female.   Chest Pain   This is a recurrent problem. The current episode started 3 to 5 hours ago. The problem occurs constantly. The problem has been gradually improving. The pain is present in the substernal region. The pain is at a severity of 5/10. The pain is moderate. The quality of the pain is described as brief and vice-like. The pain does not radiate. Associated symptoms include back pain and nausea. She has tried nothing for the symptoms. Risk factors include lack of exercise and obesity.    Past Medical History:  Diagnosis Date  . Hypertension   . Thyroid disease     There are no active problems to display for this patient.   Past Surgical History:  Procedure Laterality Date  . CHOLECYSTECTOMY    . TUBAL LIGATION      OB History    No data available       Home Medications    Prior to Admission medications   Medication Sig Start Date End Date Taking? Authorizing Provider  Cholecalciferol (VITAMIN D PO) Take 1 capsule by mouth daily.   Yes [provider]  cyanocobalamin (,VITAMIN B-12,) 1000 MCG/ML injection Inject 1,000 mcg into the muscle every 7 (seven) days.   Yes [provider]  levothyroxine (SYNTHROID, LEVOTHROID) 125 MCG tablet Take 125 mcg by mouth every morning.    Yes [provider]  topiramate (TOPAMAX) 50 MG tablet Take 1 tablet in morning and 2 tablets at bedtime. Patient taking differently: Take 50-100 mg by mouth 2 (two) times daily. Take 1 tablet in morning and 2 tablets at bedtime. 08/24/16  Yes Jaffe, Adam R, DO  UNKNOWN TO PATIENT Take 2 tablets by mouth daily. THYROID MEDICATION-NAME UNKNOWN   Yes [provider]  ondansetron (ZOFRAN) 4 MG tablet Take 1 tablet (4 mg total) by mouth every 8  (eight) hours as needed for nausea or vomiting. 05/02/17   Daishawn Lauf, Corene Cornea, MD    Family History Family History  Problem Relation Age of Onset  . Stroke Mother   . Seizures Maternal Aunt   . Aneurysm Maternal Aunt     Social History Social History  Substance Use Topics  . Smoking status: Never Smoker  . Smokeless tobacco: Never Used  . Alcohol use No     Allergies   Codeine and Gadolinium derivatives   Review of Systems Review of Systems  Cardiovascular: Positive for chest pain.  Gastrointestinal: Positive for nausea.  Musculoskeletal: Positive for back pain.  All other systems reviewed and are negative.    Physical Exam Updated Vital Signs BP 133/76   Pulse 63   Temp 98.2 F (36.8 C) (Oral)   Resp 16   Ht 5\' 5"  (1.651 m)   Wt 102.1 kg (225 lb)   LMP 04/29/2017   SpO2 100%   BMI 37.44 kg/m   Physical Exam  Constitutional: She appears well-developed and well-nourished.  HENT:  Head: Normocephalic and atraumatic.  Eyes: Conjunctivae and EOM are normal.  Neck: Normal range of motion.  Cardiovascular: Normal rate and regular rhythm.   Pulmonary/Chest: No stridor. No respiratory distress.  Abdominal: Soft. She exhibits no distension.  Neurological: She is alert.  Skin: Skin is warm and dry.  Nursing  note and vitals reviewed.    ED Treatments / Results  Labs (all labs ordered are listed, but only abnormal results are displayed) Labs Reviewed  BASIC METABOLIC PANEL - Abnormal; Notable for the following:       Result Value   Calcium 8.7 (*)    All other components within normal limits  CBC  TROPONIN I  TROPONIN I  TSH    EKG  EKG Interpretation  Date/Time:  Tuesday May 01 2017 21:12:55 EDT Ventricular Rate:  61 PR Interval:  130 QRS Duration: 92 QT Interval:  419 QTC Calculation: 422 R Axis:   51 Text Interpretation:  Sinus rhythm Low voltage, precordial leads No significant change since last tracing Confirmed by Merrily Pew [phone]) on  05/01/2017 10:28:05 PM       Radiology Dg Chest 2 View  Result Date: 05/01/2017 CLINICAL DATA:  Chest pain under the left breast EXAM: CHEST  2 VIEW COMPARISON:  07/28/2012 FINDINGS: No acute infiltrate or effusion. Heart size upper limits of normal. Mediastinal contour within normal limits. No pneumothorax. IMPRESSION: No active cardiopulmonary disease. Electronically Signed   By: Donavan Foil M.D.   On: 05/01/2017 19:26    Procedures Procedures (including critical care time)  Medications Ordered in ED Medications  ondansetron (ZOFRAN-ODT) disintegrating tablet 8 mg (8 mg Oral Given 05/02/17 0003)     Initial Impression / Assessment and Plan / ED Course  I have reviewed the triage vital signs and the nursing notes.  Pertinent labs & imaging results that were available during my care of the patient were reviewed by me and considered in my medical decision making (see chart for details).     Delta troponin is negative and an atypical story for ACS so I think this is unlikely. Low risk from Wells criteria and pertinent negative so pulmonary embolus is unlikely. No evidence of pneumothorax, pneumonia, dissection, Denied or that emergent causes on x-ray, EKG and physical exam. Unsure cause of symptoms could be musculoskeletal versus reflux however we'll continue to follow with her primary doctor for further workup and management. Will return here for any new worsening or different symptoms.  Final Clinical Impressions(s) / ED Diagnoses   Final diagnoses:  Nonspecific chest pain    New Prescriptions Discharge Medication List as of 05/02/2017 12:29 AM    START taking these medications   Details  ondansetron (ZOFRAN) 4 MG tablet Take 1 tablet (4 mg total) by mouth every 8 (eight) hours as needed for nausea or vomiting., Starting Wed 05/02/2017, Print         Mally Gavina, Corene Cornea, MD 05/02/17 [phone]

## 2017-05-04 ENCOUNTER — Emergency Department (HOSPITAL_COMMUNITY)
Admission: EM | Admit: 2017-05-04 | Discharge: 2017-05-05 | Disposition: A | Discharge status: Home / Self Care | Payer: BLUE CROSS/BLUE SHIELD | Attending: Emergency Medicine | Admitting: Emergency Medicine

## 2017-05-04 ENCOUNTER — Other Ambulatory Visit: Payer: Self-pay

## 2017-05-04 ENCOUNTER — Emergency Department (HOSPITAL_COMMUNITY): Payer: BLUE CROSS/BLUE SHIELD

## 2017-05-04 ENCOUNTER — Encounter (HOSPITAL_COMMUNITY): Payer: Self-pay | Admitting: Emergency Medicine

## 2017-05-04 DIAGNOSIS — I10 Essential (primary) hypertension: Secondary | ICD-10-CM | POA: Insufficient documentation

## 2017-05-04 DIAGNOSIS — R0602 Shortness of breath: Secondary | ICD-10-CM | POA: Diagnosis not present

## 2017-05-04 DIAGNOSIS — Z79899 Other long term (current) drug therapy: Secondary | ICD-10-CM | POA: Diagnosis not present

## 2017-05-04 DIAGNOSIS — R079 Chest pain, unspecified: Secondary | ICD-10-CM | POA: Diagnosis present

## 2017-05-04 DIAGNOSIS — Z7982 Long term (current) use of aspirin: Secondary | ICD-10-CM | POA: Insufficient documentation

## 2017-05-04 LAB — BASIC METABOLIC PANEL
ANION GAP: 7 (ref 5–15)
BUN: 14 mg/dL (ref 6–20)
CALCIUM: 9.2 mg/dL (ref 8.9–10.3)
CO2: 28 mmol/L (ref 22–32)
Chloride: 105 mmol/L (ref 101–111)
Creatinine, Ser: 0.9 mg/dL (ref 0.44–1.00)
GFR calc Af Amer: >60 mL/min (ref >60)
GFR calc non Af Amer: >60 mL/min (ref >60)
GLUCOSE: 80 mg/dL (ref 65–99)
POTASSIUM: 3.5 mmol/L (ref 3.5–5.1)
Sodium: 140 mmol/L (ref 135–145)

## 2017-05-04 LAB — CBC
HEMATOCRIT: 40.4 % (ref 36.0–46.0)
HEMOGLOBIN: 13.5 g/dL (ref 12.0–15.0)
MCH: 31.5 pg (ref 26.0–34.0)
MCHC: 33.4 g/dL (ref 30.0–36.0)
MCV: 94.4 fL (ref 78.0–100.0)
Platelets: 225 10*3/uL (ref 150–400)
RBC: 4.28 MIL/uL (ref 3.87–5.11)
RDW: 12.5 % (ref 11.5–15.5)
WBC: 9.1 10*3/uL (ref 4.0–10.5)

## 2017-05-04 LAB — TROPONIN I: Troponin I: <0.03 ng/mL (ref <0.03)

## 2017-05-04 MED ORDER — KETOROLAC TROMETHAMINE 30 MG/ML IJ SOLN
30.0000 mg | Freq: Once | INTRAMUSCULAR | Status: AC
  Administered 2017-05-04: 30 mg via INTRAVENOUS
  Filled 2017-05-04: qty 1

## 2017-05-04 MED ORDER — LORAZEPAM 1 MG PO TABS
1.0000 mg | ORAL_TABLET | Freq: Once | ORAL | Status: AC
  Administered 2017-05-04: 1 mg via ORAL
  Filled 2017-05-04: qty 1

## 2017-05-04 NOTE — ED Triage Notes (Signed)
Pt c/o chest pain that radiates down left arm with sob since 2000 while at work. Pt was seen earlier this week for the same.

## 2017-05-04 NOTE — ED Provider Notes (Signed)
[facility] DEPT Provider Note   CSN: [id number] Arrival date & time: 05/04/17  2119     History   Chief Complaint Chief Complaint  Patient presents with  . Chest Pain    HPI Tammie Powell is a 46 y.o. female who presents with left sided chest pain and SOB that began today at approximately 2000 while at work. Patient reports that pain has improved since ED arrival. He currently denies any shortness breath and emergency department. She states that it is currently a 7/10. Patient describes the chest pain as a "pressure feeling." She states that symptoms are not worsened with exertion or deep inspiration. Patient denies any diaphoresis or nausea with the chest pain started. Patient has not taken any medications for the symptoms. She was seen in the emergency department on 05/01/17 for similar symptoms. At that time she was discharged home with further instructions follow-up with her primary care doctor. She has seen her primary care doctor yesterday and is working on getting an outpatient appointment with cardiology. Patient denies any fever, chills, abdominal pain, nausea/vomiting, leg swelling, dysuria, hematuria. She denies any OCP use, recent immobilization, prior history of DVT/PE, recent surgery, leg swelling, or long travel. She denies any personal cardiac history. Patient states that her father had a heart attack at a young age and several uncles have had heart attacks. Patient denies any smoking. Patient does not do any cocaine, heroine, marijuana.   The history is provided by the patient.    Past Medical History:  Diagnosis Date  . Hypertension   . Thyroid disease     There are no active problems to display for this patient.   Past Surgical History:  Procedure Laterality Date  . CHOLECYSTECTOMY    . TUBAL LIGATION      OB History    No data available       Home Medications    Prior to Admission medications   Medication Sig Start Date End Date Taking? Authorizing  Provider  aspirin EC 81 MG tablet Take 81 mg by mouth daily.   Yes [provider]  Cholecalciferol (VITAMIN D PO) Take 1 capsule by mouth daily.   Yes [provider]  cyanocobalamin (,VITAMIN B-12,) 1000 MCG/ML injection Inject 1,000 mcg into the muscle every 7 (seven) days.   Yes [provider]  levothyroxine (SYNTHROID, LEVOTHROID) 125 MCG tablet Take 125 mcg by mouth every morning.    Yes [provider]  ondansetron (ZOFRAN) 4 MG tablet Take 1 tablet (4 mg total) by mouth every 8 (eight) hours as needed for nausea or vomiting. 05/02/17  Yes Mesner, Corene Cornea, MD  topiramate (TOPAMAX) 50 MG tablet Take 1 tablet in morning and 2 tablets at bedtime. Patient taking differently: Take 50-100 mg by mouth 2 (two) times daily. Take 1 tablet in morning and 2 tablets at bedtime. 08/24/16  Yes Jaffe, Adam R, DO  valACYclovir (VALTREX) 500 MG tablet Take 2,000 mg by mouth daily as needed.   Yes [provider]    Family History Family History  Problem Relation Age of Onset  . Stroke Mother   . Seizures Maternal Aunt   . Aneurysm Maternal Aunt     Social History Social History  Substance Use Topics  . Smoking status: Never Smoker  . Smokeless tobacco: Never Used  . Alcohol use No     Allergies   Codeine and Gadolinium derivatives   Review of Systems Review of Systems  Constitutional: Negative for chills, diaphoresis  and fever.  HENT: Negative for congestion.   Eyes: Negative for visual disturbance.  Respiratory: Positive for shortness of breath. Negative for cough.   Cardiovascular: Positive for chest pain. Negative for leg swelling.  Gastrointestinal: Negative for abdominal pain, diarrhea, nausea and vomiting.  Genitourinary: Negative for dysuria and hematuria.  Neurological: Negative for dizziness, weakness, numbness and headaches.     Physical Exam Updated Vital Signs BP 108/68   Pulse 72   Temp 98.2 F (36.8 C)   Resp 15   Ht 5'  5" (1.651 m)   Wt 102.1 kg (225 lb)   LMP 04/29/2017   SpO2 97%   BMI 37.44 kg/m   Physical Exam  Constitutional: She is oriented to person, place, and time. She appears well-developed and well-nourished.  Sitting comfortably on examination table  HENT:  Head: Normocephalic and atraumatic.  Mouth/Throat: Oropharynx is clear and moist and mucous membranes are normal.  Eyes: Pupils are equal, round, and reactive to light. Conjunctivae, EOM and lids are normal.  Neck: Full passive range of motion without pain.  Cardiovascular: Normal rate, regular rhythm, normal heart sounds and normal pulses.  Exam reveals no gallop and no friction rub.   No murmur heard. Pulmonary/Chest: Effort normal and breath sounds normal.  No evidence of respiratory distress. Able to speak in full sentences without difficulty.  Abdominal: Soft. Normal appearance. There is no tenderness. There is no rigidity and no guarding.  Musculoskeletal: Normal range of motion.  Neurological: She is alert and oriented to person, place, and time.  Cranial nerves III-XII intact Follows commands, Moves all extremities  5/5 strength to BUE and BLE  Sensation intact throughout all major nerve distributions Able to differentiate sharp and dull to all major nerve distributions Normal finger to nose. No dysdiadochokinesia. No pronator drift. No gait abnormalities  No slurred speech. No facial droop.   Skin: Skin is warm and dry. Capillary refill takes less than 2 seconds.  Psychiatric: She has a normal mood and affect. Her speech is normal.  Nursing note and vitals reviewed.    ED Treatments / Results  Labs (all labs ordered are listed, but only abnormal results are displayed) Labs Reviewed  BASIC METABOLIC PANEL  CBC  TROPONIN I  TROPONIN I    EKG  EKG Interpretation  Date/Time:  Friday May 04 2017 21:29:56 EDT Ventricular Rate:  83 PR Interval:  136 QRS Duration: 78 QT Interval:  386 QTC  Calculation: 453 R Axis:   6 Text Interpretation:  Normal sinus rhythm Normal ECG No significant change since last tracing Confirmed by Merrily Pew [phone]) on 05/04/2017 9:35:42 PM       Radiology Dg Chest 2 View  Result Date: 05/04/2017 CLINICAL DATA:  LEFT chest pain radiating to LEFT arm and shortness of breath beginning at 2000 hours. Similar symptoms recently. EXAM: CHEST  2 VIEW COMPARISON:  Chest radiograph May 01, 2017 FINDINGS: Cardiomediastinal silhouette is normal. No pleural effusions or focal consolidations. Trachea projects midline and there is no pneumothorax. Soft tissue planes and included osseous structures are non-suspicious. IMPRESSION: Stable normal chest. Electronically Signed   By: Elon Alas M.D.   On: 05/04/2017 22:21    Procedures Procedures (including critical care time)  Medications Ordered in ED Medications  ketorolac (TORADOL) 30 MG/ML injection 30 mg (30 mg Intravenous Given 05/04/17 2145)  LORazepam (ATIVAN) tablet 1 mg (1 mg Oral Given 05/04/17 2306)     Initial Impression / Assessment and Plan / ED Course  I have reviewed the triage vital signs and the nursing notes.  Pertinent labs & imaging results that were available during my care of the patient were reviewed by me and considered in my medical decision making (see chart for details).     46 year old female who presents with left-sided chest pain and shortness of breath that began approximate 8 PM this afternoon. History of same symptoms on 05/01/17. His had similar symptoms in January. Is in the process of getting an outpatient cardiology appointment. Patient is afebrile, non-toxic appearing, sitting comfortably on examination table. Vital signs reviewed. Patient is slightly hypertensive likely secondary to anxiety. Do not expect hypertensive emergency. Normal physical exam. Consider ACS etiology versus musculoskeletal pain versus infectious etiology versus anxiety. History/physical exam are  not concerning for PE. Patient is PERC negative and is low risk for PE. Patient does not need further testing for PE at this time. We will plan to check basic labs including CBC, BMP, troponin, EKG, chest x-ray. Analgesics provided in the department.  Labs and imaging reviewed. Chest x-ray is negative for any acute infectious etiology. EKG is normal sinus rhythm, rate 83 . BMP is unremarkable. CBC is unremarkable. Troponin is within normal limits.   Based on patient's history/physical and presentation she has a heart score of 3. Will plan to repeat troponin. Discussed results with patient. Updated her on plan. She is agreeable.  Reevaluation. Patient is resting comfortably. Vital signs are stable. Patient reports improvement in pain after medications. Second troponin is pending.  Repeat troponin is negative. Vital signs are improved. Blood pressure is improved after medications. Discussed results with patient. Instructed patient to continue taking aspirin as her primary care doctor as instructed. Return to provide patient with outpatient cardiology referral for further evaluation. Instructed patient to follow-up with PCP in 2 days. Strict return precautions discussed. Patient expresses understanding and agreement to plan.    Final Clinical Impressions(s) / ED Diagnoses   Final diagnoses:  Chest pain, unspecified type    New Prescriptions Discharge Medication List as of 05/05/2017  2:15 AM       Volanda Napoleon, PA-C 05/05/17 0254    Mesner, Corene Cornea, MD 05/05/17 [phone]

## 2017-05-04 NOTE — ED Notes (Signed)
Patient transported to X-ray 

## 2017-05-05 LAB — TROPONIN I: Troponin I: <0.03 ng/mL (ref <0.03)

## 2017-05-05 NOTE — Discharge Instructions (Signed)
You can take Tylenol or Ibuprofen as directed for pain.  Continue taking the aspirin as directed by her primary care doctor.  Follow-up with her primary care doctor next 24-48 hours for further evaluation.  I provided with a cardiology referral to follow-up with them on an outpatient basis. Call and arrange for an appointment next week. You can also follow up with a cardiologist see her primary care doctor is providing you.   Return the emergency Department for any worsening chest pain, difficulty breathing, difficulty walking, numbness/weakness of the arms or legs, fever, vomiting or any other worsening or concerning symptoms.

## 2017-05-29 ENCOUNTER — Telehealth: Payer: Self-pay | Admitting: Cardiology

## 2017-05-29 NOTE — Telephone Encounter (Signed)
Received records from [facility] for appointment on 06/12/17 with Dr Ellyn Hack.  Records put with Dr Allison Quarry schedule for 06/12/17. lp

## 2017-06-12 ENCOUNTER — Ambulatory Visit (INDEPENDENT_AMBULATORY_CARE_PROVIDER_SITE_OTHER): Payer: BLUE CROSS/BLUE SHIELD | Admitting: Cardiology

## 2017-06-12 ENCOUNTER — Encounter: Payer: Self-pay | Admitting: Cardiology

## 2017-06-12 VITALS — BP 118/87 | HR 72 | Ht 65.0 in | Wt 206.8 lb

## 2017-06-12 DIAGNOSIS — R000 Tachycardia, unspecified: Secondary | ICD-10-CM | POA: Insufficient documentation

## 2017-06-12 DIAGNOSIS — I10 Essential (primary) hypertension: Secondary | ICD-10-CM | POA: Diagnosis not present

## 2017-06-12 DIAGNOSIS — Z8249 Family history of ischemic heart disease and other diseases of the circulatory system: Secondary | ICD-10-CM | POA: Diagnosis not present

## 2017-06-12 DIAGNOSIS — R079 Chest pain, unspecified: Secondary | ICD-10-CM | POA: Diagnosis not present

## 2017-06-12 DIAGNOSIS — E669 Obesity, unspecified: Secondary | ICD-10-CM

## 2017-06-12 NOTE — Patient Instructions (Signed)
NO MEDICATION CHANGES   SCHEDULE AT [place] physician has requested that you have cardiac CT CALCIUM SCORING. Cardiac computed tomography (CT) is a painless test that uses an x-ray machine to take clear, detailed pictures of your heart. For further information please visit [email or web address]. Please follow instruction sheet as given. AND Your physician has recommended that you wear an event monitor 14 DAYS. Event monitors are medical devices that record the heart's electrical activity. Doctors most often [place] these monitors to diagnose arrhythmias. Arrhythmias are problems with the speed or rhythm of the heartbeat. The monitor is a small, portable device. You can wear one while you do your normal daily activities. This is usually used to diagnose what is causing palpitations/syncope (passing out).   Your physician recommends that you schedule a follow-up appointment in 2 [place].

## 2017-06-12 NOTE — Progress Notes (Signed)
PCP: Sharilyn Sites, MD  Clinic Note: Chief Complaint  Patient presents with  . New Patient (Initial Visit)  . Chest Pain  . Tachycardia    HPI: Tammie Powell is a 46 y.o. female who is being seen today for the evaluation of chest pain (ER visit) at the request of Sharilyn Sites, MD.  Tammie Powell was last seen on 2 separate occasions at the emergency room for chest discomfort.  Recent Hospitalizations: September 4, & 7 -- Chest Pain  Studies Personally Reviewed - (if available, images/films reviewed: From Epic Chart or Care Everywhere)  none  Interval History: Tammie Powell is a pleasant 46 year old woman with previously worse obesity and currently as well as hypertension and family history of father having an MI at age 36. She is here because of several episodes of on and off chest pain but to notable episodes in particular that led to an emergency room visit. Alignment the first episode occurred early in September roughly the fourth she was watching TV sitting on the couch and had some chest discomfort but no associated dyspnea or palpitations. The episode lasted somewhere between 5-10 minutes and resolve spontaneously. She did not get up and walk around and therefore cannot tell me if it was exacerbated with exertion. Was not associated with any diaphoresis or nausea vomiting. Noticing any dyspnea. 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 This did take her breath away but did not make her feel nauseated. This lasted a little bit longer maybe 15 minutes or so but was resolved by the time she actually went to the emergency room. She says she maybe took some aspirin and sat down take some deep breaths and the symptoms spontaneously resolved while in route to the ER.  Otherwise she has had some  intermittent episodes of off and on chest discomfort lasting much less time that are quite often not associated with doing any particular activity in general. Other than these 2 major episodes she doesn't seem to have the similar symptoms walking around doing routine activity or exertion. She denies any heart failure symptoms of PND, orthopnea or edema. She does feel intermittent episodes of her heart pounding and racing, beating fast. This is been going on for again a couple months. Usually when she is able to do his cough and take some deep breaths, hit her chest and a these tend to get better on her own. She does somewhat feel little bit dizzy when these episodes occur, but she has not lost consciousness. No TIA or amaurosis fugax symptoms.  The rapid heartbeat spells are not associated with the chest pain episodes.   No PND, orthopnea or edema. No weakness or syncope/near syncope. No TIA/amaurosis fugax symptoms. No melena, hematochezia, hematuria, or epstaxis. No claudication.  ROS: A comprehensive was performed. Review of Systems  Constitutional: Positive for weight loss (She does note that she's lost quite a bit of weight with diet As even though she still is obese.).  HENT: Negative for congestion, hearing loss and nosebleeds.   Eyes: Negative for blurred vision.  Respiratory: Negative for cough and shortness of breath.   Gastrointestinal: Negative for abdominal pain, blood in stool, heartburn and melena.  Genitourinary: Negative for flank pain and hematuria.  Musculoskeletal: Negative for joint pain and myalgias.  Neurological: Negative for dizziness and focal weakness.  Endo/Heme/Allergies: Negative for environmental allergies. Does not bruise/bleed easily.  Psychiatric/Behavioral: Negative for depression and memory loss. The patient is nervous/anxious. The patient does not have insomnia.   All other systems reviewed and are negative.  I have reviewed and (if needed) personally  updated the patient's problem list, medications, allergies, past medical and surgical history, social and family history.   Past Medical History:  Diagnosis Date  . Hypertension   . Thyroid disease     Past Surgical History:  Procedure Laterality Date  . CHOLECYSTECTOMY    . TUBAL LIGATION      Current Meds  Medication Sig  . aspirin EC 81 MG tablet Take 81 mg by mouth daily.  . Cholecalciferol (VITAMIN D PO) Take 1 capsule by mouth daily.  . cyanocobalamin (,VITAMIN B-12,) 1000 MCG/ML injection Inject 1,000 mcg into the muscle every 7 (seven) days.  Marland Kitchen levothyroxine (SYNTHROID, LEVOTHROID) 125 MCG tablet Take 125 mcg by mouth every morning.   . topiramate (TOPAMAX) 50 MG tablet Take 1 tablet in morning and 2 tablets at bedtime. (Patient taking differently: Take 50-100 mg by mouth 2 (two) times daily. Take 1 tablet in morning and 2 tablets at bedtime.)  . valACYclovir (VALTREX) 500 MG tablet Take 2,000 mg by mouth daily as needed.    Allergies  Allergen Reactions  . Codeine Hives, Itching and Other (See Comments)    burning  . Gadolinium Derivatives Hives    Patient had a few hives that were mildly itchy. Checked by dr Carlis Abbott, since severity was so low, and patient had no driver, he recommended she take benadryl at home. She remained in office for 40 minutes post injection to be observed.     Social History   Social History  . Marital status: Legally Separated    Spouse name: N/A  . Number of children: 3  . Years of education: N/A   Social History Main Topics  . Smoking status: Never Smoker  . Smokeless tobacco: Never Used  . Alcohol use No  . Drug use: No  . Sexual activity: Not Asked   Other Topics Concern  . None   Social History Narrative   She currently works at a [employer], building tires   She never smoked. Does not do routine exercise, but has recently started adjusting her diet.    family history includes Aneurysm in her maternal aunt; Arthritis in her  maternal grandmother; CAD in her father; Heart attack (age of onset: 69) in her father; Hypertension in her maternal grandmother and mother; Hypothyroidism in her mother, sister, and sister; Seizures in her maternal aunt; Stroke in her mother.  Wt Readings from Last 3 Encounters:  06/12/17 206 lb 12.8 oz (93.8 kg)  05/04/17 225 lb (102.1 kg)  05/01/17 225 lb (102.1 kg)    PHYSICAL EXAM BP 118/87 (BP Location: Left Arm)   Pulse 72   Ht 5\' 5"  (1.651 m)   Wt 206 lb 12.8 oz (93.8 kg)   SpO2 97%   BMI 34.41 kg/m  Physical Exam  Constitutional: She is oriented to person, place, and time. She appears well-developed and well-nourished. No distress.  Mildly obese, but healthy-appearing. Well-groomed  HENT:  Head: Normocephalic and atraumatic.  Mouth/Throat: No oropharyngeal exudate.  Eyes: Pupils are equal, round, and reactive to light. Conjunctivae and EOM are normal. No scleral icterus.  Neck: Normal range of motion. Neck supple. No hepatojugular reflux and no JVD present. Carotid bruit  is not present. No tracheal deviation present. No thyromegaly present.  Cardiovascular: Normal rate, regular rhythm, normal heart sounds and intact distal pulses.   Occasional extrasystoles are present. PMI is not displaced.  Exam reveals no gallop and no friction rub.   No murmur heard. Pulmonary/Chest: Effort normal and breath sounds normal. No respiratory distress. She has no wheezes. She has no rales.  Abdominal: Soft. Bowel sounds are normal. She exhibits no distension. There is no tenderness.  Musculoskeletal: Normal range of motion. She exhibits no edema (This puffiness in the legs, not true edema) or deformity.  Lymphadenopathy:    She has no cervical adenopathy.  Neurological: She is alert and oriented to person, place, and time. No cranial nerve deficit. She exhibits normal muscle tone.  Skin: Skin is warm and dry. No rash noted. No erythema.  Psychiatric: She has a normal mood and affect. Her  behavior is normal. Thought content normal.  Nursing note and vitals reviewed.    Adult ECG Report No new EKG - ER EKGs reviewed - no acute findings.  Other studies Reviewed: Additional studies/ records that were reviewed today include:  Recent Labs:   Lab Results  Component Value Date   CREATININE 0.90 05/04/2017   BUN 14 05/04/2017   NA 140 05/04/2017   K 3.5 05/04/2017   CL 105 05/04/2017   CO2 28 05/04/2017   No results found for: CHOL, HDL, LDLCALC, LDLDIRECT, TRIG, CHOLHDL   ASSESSMENT / PLAN: Problem List Items Addressed This Visit    Chest pain with moderate risk for cardiac etiology    Her symptoms have some typical features, but also atypical features. The fact that she has not had a recurrent episode with exertion is somewhat reassuring, but she did have both resting and mildly exertional symptoms that do sound relatively consistent with angina. Her only risk factors are that her family history and hypertension as well as obesity. No documented dyslipidemia. Pelvic to start her risk stratification by evaluating Tammie Powell calcium score. Based on that result, we can consider either coronary CTA with CT FFR versus stress test depending on the findings.      Relevant Orders   CARDIAC EVENT MONITOR   CT CARDIAC SCORING   Essential hypertension (Chronic)    Blood pressure was great on ACE inhibitor.      Family history of premature coronary artery disease (Chronic)   Relevant Orders   CT CARDIAC SCORING   Obesity (BMI 30.0-34.9) (Chronic)    This definitely limits her ability to be active and exercising with attempts to lose weight. Once we know the status of her coronaries, we can then consider a true exercise program.      Racing heart beat    Sorrell Moses and afterthought, she mentioned these rapid heartbeat spells. They do seem to happen a couple times a week. Therefore think it would like her to wear at least a two-week monitor.      Relevant Orders   CARDIAC  EVENT MONITOR      Current medicines are reviewed at length with the patient today. (+/- concerns) n/a The following changes have been made: n/a  Patient Instructions  NO MEDICATION CHANGES   SCHEDULE AT [facility] Your physician has requested that you have cardiac CT CALCIUM SCORING. Cardiac computed tomography (CT) is a painless test that uses an x-ray machine to take clear, detailed pictures of your heart. For further information please visit [facility]. Please follow instruction sheet as  given. AND Your physician has recommended that you wear an event monitor 14 DAYS. Event monitors are medical devices that record the heart's electrical activity. Doctors most often [place] these monitors to diagnose arrhythmias. Arrhythmias are problems with the speed or rhythm of the heartbeat. The monitor is a small, portable device. You can wear one while you do your normal daily activities. This is usually used to diagnose what is causing palpitations/syncope (passing out).   Your physician recommends that you schedule a follow-up appointment in 2 [place].    Studies Ordered:   Orders Placed This Encounter  Procedures  . CT CARDIAC SCORING  . CARDIAC EVENT MONITOR      Glenetta Hew, M.D., M.S. Interventional Cardiologist   Pager # [phone] Phone # [phone] [address] [place], [place] [address]

## 2017-06-13 ENCOUNTER — Encounter: Payer: Self-pay | Admitting: Cardiology

## 2017-06-13 NOTE — Assessment & Plan Note (Signed)
Blood pressure was great on ACE inhibitor.

## 2017-06-13 NOTE — Assessment & Plan Note (Signed)
This definitely limits her ability to be active and exercising with attempts to lose weight. Once we know the status of her coronaries, we can then consider a true exercise program.

## 2017-06-13 NOTE — Assessment & Plan Note (Signed)
Tammie Powell and afterthought, she mentioned these rapid heartbeat spells. They do seem to happen a couple times a week. Therefore think it would like her to wear at least a two-week monitor.

## 2017-06-13 NOTE — Assessment & Plan Note (Signed)
Her symptoms have some typical features, but also atypical features. The fact that she has not had a recurrent episode with exertion is somewhat reassuring, but she did have both resting and mildly exertional symptoms that do sound relatively consistent with angina. Her only risk factors are that her family history and hypertension as well as obesity. No documented dyslipidemia. Pelvic to start her risk stratification by evaluating Courtney calcium score. Based on that result, we can consider either coronary CTA with CT FFR versus stress test depending on the findings.

## 2017-06-25 ENCOUNTER — Emergency Department (HOSPITAL_COMMUNITY): Payer: BLUE CROSS/BLUE SHIELD

## 2017-06-25 ENCOUNTER — Encounter (HOSPITAL_COMMUNITY): Payer: Self-pay | Admitting: *Deleted

## 2017-06-25 DIAGNOSIS — R110 Nausea: Secondary | ICD-10-CM | POA: Diagnosis not present

## 2017-06-25 DIAGNOSIS — R0602 Shortness of breath: Secondary | ICD-10-CM | POA: Diagnosis not present

## 2017-06-25 DIAGNOSIS — Z7982 Long term (current) use of aspirin: Secondary | ICD-10-CM | POA: Diagnosis not present

## 2017-06-25 DIAGNOSIS — I10 Essential (primary) hypertension: Secondary | ICD-10-CM | POA: Diagnosis not present

## 2017-06-25 DIAGNOSIS — R51 Headache: Secondary | ICD-10-CM | POA: Diagnosis not present

## 2017-06-25 DIAGNOSIS — Z79899 Other long term (current) drug therapy: Secondary | ICD-10-CM | POA: Diagnosis not present

## 2017-06-25 DIAGNOSIS — E039 Hypothyroidism, unspecified: Secondary | ICD-10-CM | POA: Insufficient documentation

## 2017-06-25 DIAGNOSIS — R072 Precordial pain: Secondary | ICD-10-CM | POA: Diagnosis not present

## 2017-06-25 DIAGNOSIS — R079 Chest pain, unspecified: Secondary | ICD-10-CM | POA: Diagnosis present

## 2017-06-25 LAB — BASIC METABOLIC PANEL
Anion gap: 8 (ref 5–15)
BUN: 13 mg/dL (ref 6–20)
CHLORIDE: 105 mmol/L (ref 101–111)
CO2: 23 mmol/L (ref 22–32)
CREATININE: 0.84 mg/dL (ref 0.44–1.00)
Calcium: 9.1 mg/dL (ref 8.9–10.3)
GFR calc Af Amer: >60 mL/min (ref >60)
GFR calc non Af Amer: >60 mL/min (ref >60)
GLUCOSE: 93 mg/dL (ref 65–99)
POTASSIUM: 3.9 mmol/L (ref 3.5–5.1)
Sodium: 136 mmol/L (ref 135–145)

## 2017-06-25 LAB — I-STAT TROPONIN, ED: Troponin i, poc: 0 ng/mL (ref 0.00–0.08)

## 2017-06-25 LAB — CBC
HEMATOCRIT: 41.2 % (ref 36.0–46.0)
Hemoglobin: 13.5 g/dL (ref 12.0–15.0)
MCH: 30.7 pg (ref 26.0–34.0)
MCHC: 32.8 g/dL (ref 30.0–36.0)
MCV: 93.6 fL (ref 78.0–100.0)
Platelets: 186 10*3/uL (ref 150–400)
RBC: 4.4 MIL/uL (ref 3.87–5.11)
RDW: 13 % (ref 11.5–15.5)
WBC: 7.4 10*3/uL (ref 4.0–10.5)

## 2017-06-25 LAB — I-STAT BETA HCG BLOOD, ED (MC, WL, AP ONLY): I-stat hCG, quantitative: <5 m[IU]/mL (ref <5)

## 2017-06-25 NOTE — ED Triage Notes (Signed)
Pt is d/t have heart monitor placed on Wed for recent sob.  However, she has been feeling weak this weekend and began having chest pain, L arm numbness and increased sob.

## 2017-06-26 ENCOUNTER — Emergency Department (HOSPITAL_COMMUNITY)
Admission: EM | Admit: 2017-06-26 | Discharge: 2017-06-26 | Disposition: A | Discharge status: Home / Self Care | Payer: BLUE CROSS/BLUE SHIELD | Attending: Emergency Medicine | Admitting: Emergency Medicine

## 2017-06-26 ENCOUNTER — Other Ambulatory Visit: Payer: Self-pay

## 2017-06-26 DIAGNOSIS — R072 Precordial pain: Secondary | ICD-10-CM

## 2017-06-26 LAB — I-STAT TROPONIN, ED: Troponin i, poc: 0 ng/mL (ref 0.00–0.08)

## 2017-06-26 NOTE — ED Notes (Signed)
Pt still complaining of SOB and left arm going numb. No CP at this time

## 2017-06-26 NOTE — ED Provider Notes (Signed)
[facility] EMERGENCY DEPARTMENT Provider Note   CSN: [id number] Arrival date & time: 06/25/17  1823     History   Chief Complaint Chief Complaint  Patient presents with  . Shortness of Breath  . Chest Pain    HPI Tammie Powell is a 46 y.o. female.  The history is provided by the patient.  Chest Pain   This is a new problem. The current episode started 12 to 24 hours ago. The problem occurs constantly. The problem has been resolved. The pain is present in the substernal region. The pain is moderate. Quality: "WRINGING A DISH TOWEL" The pain does not radiate. Episode Length: "several hours" Associated symptoms include headaches, nausea and shortness of breath. Pertinent negatives include no diaphoresis, no leg pain, no lower extremity edema, no syncope, no vomiting and no weakness. Associated symptoms comments: "left arm asleep" . She has tried nothing for the symptoms.  Pertinent negatives for past medical history include no CAD and no PE.  pt presents with episode of CP She reports at approximatesly 1330 on 10/29 she had onset of CP with "left arm asleep" All of her symptoms are similar to prior episodes of CP After waiting in the ED, her CP is improving, left arm still feels asleep No fever/vomiting No focal weakness in arms/legs No other numbness reported. She has had cardiology f/u previously and testing planned on 10/31   Past Medical History:  Diagnosis Date  . Hypertension   . Thyroid disease     Patient Active Problem List   Diagnosis Date Noted  . Family history of premature coronary artery disease 06/12/2017  . Chest pain with moderate risk for cardiac etiology 06/12/2017  . Essential hypertension 06/12/2017  . Obesity (BMI 30.0-34.9) 06/12/2017  . Racing heart beat 06/12/2017    Past Surgical History:  Procedure Laterality Date  . CHOLECYSTECTOMY    . TUBAL LIGATION      OB History    No data available       Home  Medications    Prior to Admission medications   Medication Sig Start Date End Date Taking? Authorizing Provider  aspirin EC 81 MG tablet Take 81 mg by mouth daily.    [provider]  Cholecalciferol (VITAMIN D PO) Take 1 capsule by mouth daily.    [provider]  cyanocobalamin (,VITAMIN B-12,) 1000 MCG/ML injection Inject 1,000 mcg into the muscle every 7 (seven) days.    [provider]  levothyroxine (SYNTHROID, LEVOTHROID) 125 MCG tablet Take 125 mcg by mouth every morning.     [provider]  topiramate (TOPAMAX) 50 MG tablet Take 1 tablet in morning and 2 tablets at bedtime. Patient taking differently: Take 50-100 mg by mouth 2 (two) times daily. Take 1 tablet in morning and 2 tablets at bedtime. 08/24/16   Pieter Partridge, DO  valACYclovir (VALTREX) 500 MG tablet Take 2,000 mg by mouth daily as needed.    [provider]    Family History Family History  Problem Relation Age of Onset  . Stroke Mother   . Hypertension Mother   . Hypothyroidism Mother   . Seizures Maternal Aunt   . Aneurysm Maternal Aunt   . Heart attack Father 32  . CAD Father        stents  . Hypothyroidism Sister   . Hypertension Maternal Grandmother   . Arthritis Maternal Grandmother   . Hypothyroidism Sister     Social History Social History  Substance Use Topics  . Smoking status: Never Smoker  . Smokeless tobacco: Never Used  . Alcohol use No     Allergies   Codeine and Gadolinium derivatives   Review of Systems Review of Systems  Constitutional: Negative for diaphoresis.  Respiratory: Positive for shortness of breath.   Cardiovascular: Positive for chest pain. Negative for leg swelling and syncope.  Gastrointestinal: Positive for nausea. Negative for vomiting.  Neurological: Positive for headaches. Negative for syncope and weakness.       Chronic headache   All other systems reviewed and are negative.    Physical Exam Updated Vital  Signs BP 137/87   Pulse 68   Temp 98 F (36.7 C)   Resp (!) 21   Ht 1.651 m (5\' 5" )   Wt 92.5 kg (204 lb)   LMP 06/11/2017   SpO2 100%   BMI 33.95 kg/m   Physical Exam CONSTITUTIONAL: Well developed/well nourished HEAD: Normocephalic/atraumatic EYES: EOMI/PERRL ENMT: Mucous membranes moist NECK: supple no meningeal signs SPINE/BACK:entire spine nontender CV: S1/S2 noted, no murmurs/rubs/gallops noted LUNGS: Lungs are clear to auscultation bilaterally, no apparent distress ABDOMEN: soft, nontender, no rebound or guarding, bowel sounds noted throughout abdomen GU:no cva tenderness NEURO: Pt is awake/alert/appropriate, moves all extremitiesx4.  No facial droop.  Equal strength noted in bilateral UE with equal hand grips noted.  No focal weakness lower extremities. Reports left arm feels "asleep" EXTREMITIES: pulses normal/equal, full ROM, no calf tenderness or LE edema.   SKIN: warm, color normal PSYCH: no abnormalities of mood noted, alert and oriented to situation   ED Treatments / Results  Labs (all labs ordered are listed, but only abnormal results are displayed) Labs Reviewed  BASIC METABOLIC PANEL  CBC  I-STAT TROPONIN, ED  I-STAT BETA HCG BLOOD, ED ([facility], [facility], [facility] ONLY)  I-STAT TROPONIN, ED    EKG  EKG Interpretation  Date/Time:  Monday June 25 2017 18:31:31 EDT Ventricular Rate:  71 PR Interval:  140 QRS Duration: 74 QT Interval:  378 QTC Calculation: 410 R Axis:   71 Text Interpretation:  Normal sinus rhythm Interpretation limited secondary to artifact Otherwise no significant change Confirmed by Ripley Fraise [phone]) on 06/26/2017 3:59:53 AM       Radiology Dg Chest 2 View  Result Date: 06/25/2017 CLINICAL DATA:  Left-sided chest pain and dyspnea x1 day. EXAM: CHEST  2 VIEW COMPARISON:  05/04/2017 FINDINGS: The heart size and mediastinal contours are within normal limits. Both lungs are clear. The visualized skeletal structures are unremarkable.  IMPRESSION: No active cardiopulmonary disease. Electronically Signed   By: Ashley Royalty M.D.   On: 06/25/2017 20:18    Procedures Procedures (including critical care time)  Medications Ordered in ED Medications - No data to display   Initial Impression / Assessment and Plan / ED Course  I have reviewed the triage vital signs and the nursing notes.  Pertinent labs & imaging results that were available during my care of the patient were reviewed by me and considered in my medical decision making (see chart for details).     4:54 AM HEART score 3 PERC negative She has further cardiac testing scheduled on 10/31 (CT coronary) per cardiology   At time of discharge After monitoring in the ED, pt improved No new CP Reports left arm is improved She has cardiology testing tomorrow We discussed return precautions  Final Clinical Impressions(s) / ED Diagnoses   Final diagnoses:  Precordial pain    New Prescriptions New Prescriptions  No medications on file     Ripley Fraise, MD 06/26/17 [phone]

## 2017-06-26 NOTE — Discharge Instructions (Signed)

## 2017-06-26 NOTE — ED Notes (Signed)
Patient denies pain and is resting comfortably.  

## 2017-06-27 ENCOUNTER — Ambulatory Visit (INDEPENDENT_AMBULATORY_CARE_PROVIDER_SITE_OTHER)
Admission: RE | Admit: 2017-06-27 | Discharge: 2017-06-27 | Disposition: A | Discharge status: Home / Self Care | Payer: Self-pay | Source: Ambulatory Visit | Attending: Cardiology | Admitting: Cardiology

## 2017-06-27 ENCOUNTER — Ambulatory Visit (INDEPENDENT_AMBULATORY_CARE_PROVIDER_SITE_OTHER): Payer: BLUE CROSS/BLUE SHIELD

## 2017-06-27 DIAGNOSIS — Z8249 Family history of ischemic heart disease and other diseases of the circulatory system: Secondary | ICD-10-CM

## 2017-06-27 DIAGNOSIS — R079 Chest pain, unspecified: Secondary | ICD-10-CM

## 2017-06-27 DIAGNOSIS — R000 Tachycardia, unspecified: Secondary | ICD-10-CM

## 2017-06-28 ENCOUNTER — Telehealth: Payer: Self-pay | Admitting: Cardiology

## 2017-06-28 NOTE — Telephone Encounter (Signed)
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 She states that she is expecting for them to give her some forms for Dr Ellyn Hack to fill our, would he be willing to fill them out? Informed pt that it may take a week to 10 days to fill out forms and may be a charge. She is agreeable to this.

## 2017-06-28 NOTE — Telephone Encounter (Signed)
Pt is wearing a 14 days monitor. 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 She wants to know if Dr Ellyn Hack will write her a note for this please,so she can still get paid?

## 2017-07-01 ENCOUNTER — Emergency Department (HOSPITAL_COMMUNITY): Payer: BLUE CROSS/BLUE SHIELD

## 2017-07-01 ENCOUNTER — Encounter (HOSPITAL_COMMUNITY): Payer: Self-pay | Admitting: Emergency Medicine

## 2017-07-01 ENCOUNTER — Other Ambulatory Visit: Payer: Self-pay

## 2017-07-01 ENCOUNTER — Emergency Department (HOSPITAL_COMMUNITY)
Admission: EM | Admit: 2017-07-01 | Discharge: 2017-07-01 | Disposition: A | Discharge status: Home / Self Care | Payer: BLUE CROSS/BLUE SHIELD | Attending: Physician Assistant | Admitting: Physician Assistant

## 2017-07-01 ENCOUNTER — Telehealth: Payer: Self-pay | Admitting: Cardiology

## 2017-07-01 DIAGNOSIS — R079 Chest pain, unspecified: Secondary | ICD-10-CM | POA: Insufficient documentation

## 2017-07-01 DIAGNOSIS — Z7982 Long term (current) use of aspirin: Secondary | ICD-10-CM | POA: Diagnosis not present

## 2017-07-01 DIAGNOSIS — Z79899 Other long term (current) drug therapy: Secondary | ICD-10-CM | POA: Insufficient documentation

## 2017-07-01 DIAGNOSIS — I10 Essential (primary) hypertension: Secondary | ICD-10-CM | POA: Insufficient documentation

## 2017-07-01 LAB — BASIC METABOLIC PANEL
Anion gap: 6 (ref 5–15)
BUN: 13 mg/dL (ref 6–20)
CHLORIDE: 106 mmol/L (ref 101–111)
CO2: 25 mmol/L (ref 22–32)
CREATININE: 0.88 mg/dL (ref 0.44–1.00)
Calcium: 9.1 mg/dL (ref 8.9–10.3)
GFR calc Af Amer: >60 mL/min (ref >60)
GFR calc non Af Amer: >60 mL/min (ref >60)
GLUCOSE: 105 mg/dL — AB (ref 65–99)
POTASSIUM: 4.2 mmol/L (ref 3.5–5.1)
Sodium: 137 mmol/L (ref 135–145)

## 2017-07-01 LAB — I-STAT TROPONIN, ED
TROPONIN I, POC: 0 ng/mL (ref 0.00–0.08)
Troponin i, poc: 0 ng/mL (ref 0.00–0.08)

## 2017-07-01 LAB — CBC
HEMATOCRIT: 43.7 % (ref 36.0–46.0)
Hemoglobin: 14.7 g/dL (ref 12.0–15.0)
MCH: 31.5 pg (ref 26.0–34.0)
MCHC: 33.6 g/dL (ref 30.0–36.0)
MCV: 93.6 fL (ref 78.0–100.0)
PLATELETS: 233 10*3/uL (ref 150–400)
RBC: 4.67 MIL/uL (ref 3.87–5.11)
RDW: 12.6 % (ref 11.5–15.5)
WBC: 6.6 10*3/uL (ref 4.0–10.5)

## 2017-07-01 NOTE — ED Notes (Signed)
Pt stable, ambulatory, and verbalizes understanding of d/c instructions.  

## 2017-07-01 NOTE — ED Notes (Signed)
Called PT again, No answer. Moved PT from [place] area.Marland KitchenMarland Kitchen

## 2017-07-01 NOTE — Telephone Encounter (Signed)
Seems relatively unreasonable for the job did not allow her to wear a monitor.  This should not in any way interfere with her work.  She is having symptoms that we are trying to evaluate.  Glenetta Hew, MD

## 2017-07-01 NOTE — ED Provider Notes (Signed)
[facility] EMERGENCY DEPARTMENT Provider Note   CSN: [id number] Arrival date & time: 07/01/17  1126     History   Chief Complaint Chief Complaint  Patient presents with  . Chest Pain    HPI Tammie Powell is a 46 y.o. female.  HPI  46 y.o. female with a hx of HTN, presents to the Emergency Department today due to chest pain this AM with palpitations. Pt called triage nurse and was told to come to ED. Pt states that chest pain occurred at rest while lying in bed. Notes centralized chest pressure with unknown duration. Pt suspects <1 hour. Associated numbness to left arm as well as nausea. No diaphoresis. Pt with hx of same on multiple times in the past x2 months. Pt Seen by Cardiology on 06-12-17 for same. Placed on Holter monitor. Pt denies pain currently. No cough/congestion. No fevers. Pt concerned due to [place] with father having MI at age 43. It was discussed at previous Cardiology appointment for possible Coronary CTA with CT FFR vs Stress Test if this reoccurred. No other symptoms noted    Cardiologist- Dr. Ellyn Hack   Past Medical History:  Diagnosis Date  . Hypertension   . Thyroid disease     Patient Active Problem List   Diagnosis Date Noted  . Family history of premature coronary artery disease 06/12/2017  . Chest pain with moderate risk for cardiac etiology 06/12/2017  . Essential hypertension 06/12/2017  . Obesity (BMI 30.0-34.9) 06/12/2017  . Racing heart beat 06/12/2017    Past Surgical History:  Procedure Laterality Date  . CHOLECYSTECTOMY    . TUBAL LIGATION      OB History    No data available       Home Medications    Prior to Admission medications   Medication Sig Start Date End Date Taking? Authorizing Provider  aspirin EC 81 MG tablet Take 81 mg by mouth daily.    [provider]  Cholecalciferol (VITAMIN D PO) Take 1 capsule by mouth daily.    [provider]  cyanocobalamin (,VITAMIN B-12,) 1000 MCG/ML  injection Inject 1,000 mcg into the muscle every 7 (seven) days.    [provider]  levothyroxine (SYNTHROID, LEVOTHROID) 125 MCG tablet Take 125 mcg by mouth every morning.     [provider]  topiramate (TOPAMAX) 50 MG tablet Take 1 tablet in morning and 2 tablets at bedtime. Patient taking differently: Take 50-100 mg by mouth 2 (two) times daily. Take 1 tablet in morning and 2 tablets at bedtime. 08/24/16   Pieter Partridge, DO  valACYclovir (VALTREX) 500 MG tablet Take 2,000 mg by mouth daily as needed.    [provider]    Family History Family History  Problem Relation Age of Onset  . Stroke Mother   . Hypertension Mother   . Hypothyroidism Mother   . Seizures Maternal Aunt   . Aneurysm Maternal Aunt   . Heart attack Father 38  . CAD Father        stents  . Hypothyroidism Sister   . Hypertension Maternal Grandmother   . Arthritis Maternal Grandmother   . Hypothyroidism Sister     Social History Social History   Tobacco Use  . Smoking status: Never Smoker  . Smokeless tobacco: Never Used  Substance Use Topics  . Alcohol use: No  . Drug use: No     Allergies   Codeine and Gadolinium derivatives   Review of Systems Review of  Systems ROS reviewed and all are negative for acute change except as noted in the HPI.  Physical Exam Updated Vital Signs BP 121/89 (BP Location: Right Arm)   Pulse 86   Temp 98.5 F (36.9 C) (Oral)   Resp 17   Ht 5\' 5"  (1.651 m)   Wt 90.7 kg (200 lb)   LMP 06/17/2017   SpO2 100%   BMI 33.28 kg/m   Physical Exam  Constitutional: She is oriented to person, place, and time. She appears well-developed and well-nourished. No distress.  HENT:  Head: Normocephalic and atraumatic.  Right Ear: Tympanic membrane, external ear and ear canal normal.  Left Ear: Tympanic membrane, external ear and ear canal normal.  Nose: Nose normal.  Mouth/Throat: Uvula is midline, oropharynx is clear and moist and mucous  membranes are normal. No trismus in the jaw. No oropharyngeal exudate, posterior oropharyngeal erythema or tonsillar abscesses.  Eyes: EOM are normal. Pupils are equal, round, and reactive to light.  Neck: Normal range of motion. Neck supple. No tracheal deviation present.  Cardiovascular: Normal rate, regular rhythm, S1 normal, S2 normal, normal heart sounds, intact distal pulses and normal pulses.  Pulmonary/Chest: Effort normal and breath sounds normal. No respiratory distress. She has no decreased breath sounds. She has no wheezes. She has no rhonchi. She has no rales.  Abdominal: Normal appearance and bowel sounds are normal. There is no tenderness.  Musculoskeletal: Normal range of motion.  Neurological: She is alert and oriented to person, place, and time.  Skin: Skin is warm and dry.  Psychiatric: She has a normal mood and affect. Her speech is normal and behavior is normal. Thought content normal.  Nursing note and vitals reviewed.  ED Treatments / Results  Labs (all labs ordered are listed, but only abnormal results are displayed) Labs Reviewed  BASIC METABOLIC PANEL - Abnormal; Notable for the following components:      Result Value   Glucose, Bld 105 (*)    All other components within normal limits  CBC  I-STAT TROPONIN, ED  I-STAT TROPONIN, ED    EKG  EKG Interpretation None       Radiology Dg Chest 2 View  Result Date: 07/01/2017 CLINICAL DATA:  46 year old female with chest pain. EXAM: CHEST  2 VIEW COMPARISON:  06/25/2017 FINDINGS: The heart size and mediastinal contours are within normal limits. Both lungs are clear. The visualized skeletal structures are unremarkable. IMPRESSION: No active cardiopulmonary disease. Electronically Signed   By: Kristopher Oppenheim M.D.   On: 07/01/2017 13:58    Procedures Procedures (including critical care time)  Medications Ordered in ED Medications - No data to display   Initial Impression / Assessment and Plan / ED Course    I have reviewed the triage vital signs and the nursing notes.  Pertinent labs & imaging results that were available during my care of the patient were reviewed by me and considered in my medical decision making (see chart for details).  Final Clinical Impressions(s) / ED Diagnoses  {I have reviewed and evaluated the relevant laboratory values. {I have reviewed and evaluated the relevant imaging studies. {I have interpreted the relevant EKG. {I have reviewed the relevant previous healthcare records.  {I obtained HPI from historian.   ED Course:  Assessment: Pt is a 46 y.o. female with a hx of HTN, presents to the Emergency Department today due to chest pain this AM with palpitations. Pt called triage nurse and was told to come to ED. Pt states that  chest pain occurred at rest while lying in bed. Notes centralized chest pressure with unknown duration. Pt suspects <1 hour. Associated numbness to left arm as well as nausea. No diaphoresis. Pt with hx of same on multiple times in the past x2 months. Pt Seen by Cardiology on 06-12-17 for same. Placed on Holter monitor. Pt denies pain currently. No cough/congestion. No fevers. Pt concerned due to [place] with father having MI at age 21. It was discussed at previous Cardiology appointment for possible Coronary CTA with CT FFR vs Stress Test if this reoccurred. Patient is to be discharged with recommendation to follow up with Cardiology in regards to today's hospital visit. Perc negative, VSS, no tracheal deviation, no JVD or new murmur, RRR, breath sounds equal bilaterally, EKG without acute abnormalities, negative troponin and negative CXR. Heart Score 3. Consult placed to Cardiology Radford Pax) who recommended delta Troponin. Pt without active chest pain. Discussed close outpatient follow up and strict return precautions if Troponin negative.  3:47 PM- delta Trop negative. Discussed results with patient. At time of discharge, Patient is in no acute distress. Vital  Signs are stable. Patient is able to ambulate. Patient able to tolerate PO.    Disposition/Plan:  DC Home Strict Return Precautions given Close follow up with Cardiology Pt acknowledges and agrees with plan  Supervising Physician Mackuen, Courteney Lyn, *  Final diagnoses:  Chest pain, unspecified type    New Prescriptions This SmartLink is deprecated. Use AVSMEDLIST instead to display the medication list for a patient.     Shary Decamp, PA-C 07/01/17 1548    Mackuen, Fredia Sorrow, MD 07/03/17 2300

## 2017-07-01 NOTE — ED Notes (Signed)
Pt called 2x from lobby with no answer

## 2017-07-01 NOTE — Telephone Encounter (Signed)
Patient called stating she has been having episodes of chest pain over the past 2 days. Recently seen in the office and had a CT with Ca+ score of 7. Also had a holter monitor placed. Today chest pain is worse, nauseated, short of breath, and pain into the arms and back. States she has been weak, and in the bed for the past 2 days. Instructed her to come in for evaluation, have a family member drive her. Patient agreeable to this plan, and thanked me for follow up call.

## 2017-07-01 NOTE — ED Notes (Signed)
Called PT twice to be taken back to room, no answer. Placed PT back into Lobby.Marland KitchenMarland KitchenMarland Kitchen

## 2017-07-01 NOTE — ED Triage Notes (Signed)
Pt. Stated, I was just seen for palpitations and Im having chest pain this morning and I called and they said to come here./

## 2017-07-01 NOTE — Discharge Instructions (Signed)
Please read and follow all provided instructions.  Your diagnoses today include:  1. Chest pain, unspecified type    Tests performed today include: An EKG of your heart A chest x-ray Cardiac enzymes - a blood test for heart muscle damage Blood counts and electrolytes Vital signs. See below for your results today.   Medications prescribed:   Take any prescribed medications only as directed.  Follow-up instructions: Please follow-up with your primary care provider as soon as you can for further evaluation of your symptoms.   Return instructions:  SEEK IMMEDIATE MEDICAL ATTENTION IF: You have severe chest pain, especially if the pain is crushing or pressure-like and spreads to the arms, back, neck, or jaw, or if you have sweating, nausea (feeling sick to your stomach), or shortness of breath. THIS IS AN EMERGENCY. Don't wait to see if the pain will go away. Get medical help at once. Call 911 or [phone] (operator). DO NOT drive yourself to the hospital.  Your chest pain gets worse and does not go away with rest.  You have an attack of chest pain lasting longer than usual, despite rest and treatment with the medications your caregiver has prescribed.  You wake from sleep with chest pain or shortness of breath. You feel dizzy or faint. You have chest pain not typical of your usual pain for which you originally saw your caregiver.  You have any other emergent concerns regarding your health.  Additional Information: Chest pain comes from many different causes. Your caregiver has diagnosed you as having chest pain that is not specific for one problem, but does not require admission.  You are at low risk for an acute heart condition or other serious illness.   Your vital signs today were: BP (!) 138/109    Pulse 84    Temp 98.5 F (36.9 C) (Oral)    Resp 14    Ht 5\' 5"  (1.651 m)    Wt 90.7 kg (200 lb)    LMP 06/17/2017    SpO2 100%    BMI 33.28 kg/m  If your blood pressure (BP) was elevated above  135/85 this visit, please have this repeated by your doctor within one month. --------------

## 2017-07-03 NOTE — Telephone Encounter (Signed)
Pt has appt to discuss ED visit on 07-06-17

## 2017-07-06 ENCOUNTER — Ambulatory Visit: Payer: BLUE CROSS/BLUE SHIELD | Admitting: Cardiology

## 2017-07-06 ENCOUNTER — Encounter: Payer: Self-pay | Admitting: Cardiology

## 2017-07-06 VITALS — BP 126/78 | HR 76 | Ht 65.0 in | Wt 212.0 lb

## 2017-07-06 DIAGNOSIS — Z8249 Family history of ischemic heart disease and other diseases of the circulatory system: Secondary | ICD-10-CM

## 2017-07-06 DIAGNOSIS — R000 Tachycardia, unspecified: Secondary | ICD-10-CM | POA: Diagnosis not present

## 2017-07-06 DIAGNOSIS — I10 Essential (primary) hypertension: Secondary | ICD-10-CM

## 2017-07-06 DIAGNOSIS — R079 Chest pain, unspecified: Secondary | ICD-10-CM | POA: Diagnosis not present

## 2017-07-06 MED ORDER — PREDNISONE 10 MG PO TABS: ORAL_TABLET | ORAL | 0 refills | Status: AC

## 2017-07-06 MED ORDER — NITROGLYCERIN 0.4 MG SL SUBL: 0.4000 mg | SUBLINGUAL_TABLET | SUBLINGUAL | 6 refills | Status: DC | PRN

## 2017-07-06 MED ORDER — AMLODIPINE BESYLATE 2.5 MG PO TABS: 2.5000 mg | ORAL_TABLET | Freq: Every day | ORAL | 6 refills | Status: DC

## 2017-07-06 NOTE — Patient Instructions (Signed)
Medication instructions   -- start Amlodipine 2.5 mg one tablet daily --as needed nitroglycerin .4 mg sublingual every 5 min up to 3 times each episodes  -- use prednisone tablets as Dr Ellyn Hack instructed.    Okay to return to work after you have completed wearing the monitor.    Your physician recommends that you schedule a follow-up appointment in JAN 2019   Nitroglycerin sublingual tablets What is this medicine? NITROGLYCERIN (nye troe GLI ser in) is a type of vasodilator. It relaxes blood vessels, increasing the blood and oxygen supply to your heart. This medicine is used to relieve chest pain caused by angina. It is also used to prevent chest pain before activities like climbing stairs, going outdoors in cold weather, or sexual activity. This medicine may be used for other purposes; ask your health care provider or pharmacist if you have questions. COMMON BRAND NAME(S): Nitroquick, Nitrostat, Nitrotab What should I tell my health care provider before I take this medicine? They need to know if you have any of these conditions: -anemia -head injury, recent stroke, or bleeding in the brain -liver disease -previous heart attack -an unusual or allergic reaction to nitroglycerin, other medicines, foods, dyes, or preservatives -pregnant or trying to get pregnant -breast-feeding How should I use this medicine? Take this medicine by mouth as needed. At the first sign of an angina attack (chest pain or tightness) place one tablet under your tongue. You can also take this medicine 5 to 10 minutes before an event likely to produce chest pain. Follow the directions on the prescription label. Let the tablet dissolve under the tongue. Do not swallow whole. Replace the dose if you accidentally swallow it. It will help if your mouth is not dry. Saliva around the tablet will help it to dissolve more quickly. Do not eat or drink, smoke or chew tobacco while a tablet is dissolving. If you are not  better within 5 minutes after taking ONE dose of nitroglycerin, call 9-1-1 immediately to seek emergency medical care. Do not take more than 3 nitroglycerin tablets over 15 minutes. If you take this medicine often to relieve symptoms of angina, your doctor or health care professional may provide you with different instructions to manage your symptoms. If symptoms do not go away after following these instructions, it is important to call 9-1-1 immediately. Do not take more than 3 nitroglycerin tablets over 15 minutes. Talk to your pediatrician regarding the use of this medicine in children. Special care may be needed. Overdosage: If you think you have taken too much of this medicine contact a poison control center or emergency room at once. NOTE: This medicine is only for you. Do not share this medicine with others. What if I miss a dose? This does not apply. This medicine is only used as needed. What may interact with this medicine? Do not take this medicine with any of the following medications: -certain migraine medicines like ergotamine and dihydroergotamine (DHE) -medicines used to treat erectile dysfunction like sildenafil, tadalafil, and vardenafil -riociguat This medicine may also interact with the following medications: -alteplase -aspirin -heparin -medicines for high blood pressure -medicines for mental depression -other medicines used to treat angina -phenothiazines like chlorpromazine, mesoridazine, prochlorperazine, thioridazine This list may not describe all possible interactions. Give your health care provider a list of all the medicines, herbs, non-prescription drugs, or dietary supplements you use. Also tell them if you smoke, drink alcohol, or use illegal drugs. Some items may interact with your medicine. What  should I watch for while using this medicine? Tell your doctor or health care professional if you feel your medicine is no longer working. Keep this medicine with you at  all times. Sit or lie down when you take your medicine to prevent falling if you feel dizzy or faint after using it. Try to remain calm. This will help you to feel better faster. If you feel dizzy, take several deep breaths and lie down with your feet propped up, or bend forward with your head resting between your knees. You may get drowsy or dizzy. Do not drive, use machinery, or do anything that needs mental alertness until you know how this drug affects you. Do not stand or sit up quickly, especially if you are an older patient. This reduces the risk of dizzy or fainting spells. Alcohol can make you more drowsy and dizzy. Avoid alcoholic drinks. Do not treat yourself for coughs, colds, or pain while you are taking this medicine without asking your doctor or health care professional for advice. Some ingredients may increase your blood pressure. What side effects may I notice from receiving this medicine? Side effects that you should report to your doctor or health care professional as soon as possible: -blurred vision -dry mouth -skin rash -sweating -the feeling of extreme pressure in the head -unusually weak or tired Side effects that usually do not require medical attention (report to your doctor or health care professional if they continue or are bothersome): -flushing of the face or neck -headache -irregular heartbeat, palpitations -nausea, vomiting This list may not describe all possible side effects. Call your doctor for medical advice about side effects. You may report side effects to FDA at 1-800-FDA-1088. Where should I keep my medicine? Keep out of the reach of children. Store at room temperature between 20 and 25 degrees C (68 and 77 degrees F). Store in [employer]. Protect from light and moisture. Keep tightly closed. Throw away any unused medicine after the expiration date. NOTE: This sheet is a summary. It may not cover all possible information. If you have questions about  this medicine, talk to your doctor, pharmacist, or health care provider.  2018 [employer]vier/Gold Standard (2013-06-12 17:57:36)

## 2017-07-06 NOTE — Progress Notes (Signed)
PCP: Sharilyn Sites, MD  Clinic Note: No chief complaint on file.   HPI: Tammie Powell is a 46 y.o. female who is being seen today for the evaluation of chest pain (ER visit) at the request of Sharilyn Sites, MD.  Tammie Powell was last seen on 2 separate occasions at the emergency room for chest discomfort.  Recent Hospitalizations: September 4, & 7 -- Chest Pain  Studies Personally Reviewed - (if available, images/films reviewed: From Epic Chart or Care Everywhere)  Coronary calcium score of 7. This was 36 percentile for age and sex matched control.  Still wearing monitor  Interval History: Tammie Powell returns today indicating that she has had at least 2 more episodes of chest pain they have taken her to the hospital.  She went on October 25 as well as November 4 to the emergency room.  Both times ruled out for MI. She is still wearing her monitor that is due to return in next Wednesday.  Unfortunately she was unable to wear this monitor at work, because of concerns for liability.  She tells me that these episodes of chest pain will last up to 45 minutes with a start in her chest and go up to her left arm and her left arm will go numb.  She has just been quite scared with these episodes and is not really sure what is going on.  She tells me that they always happen at rest are never exertional.  She may have some exertional dyspnea but nothing significant.  She is very happy to hear about the results of her coronary calcium score (she found out about it when she went to the hospital). The only dyspnea she notes is when the pain initially starts it takes her breath away.  It is not much that she can do to make it go away.  She usually takes aspirin and that makes it go away after a while. She does indicate that she has a significant amount of stress and has been having a hard time sleeping.  When she does sleep she sleeps on her left side predominantly.  She does not have any PND orthopnea.   No edema.  She is wearing the monitor and does not really aware of any significant rapid heartbeat spells of late. No syncope/sick near syncope or TIA/amaurosis fugax  Less frequent rapid heartbeat spells. No claudication  ROS: A comprehensive was performed. Review of Systems  Constitutional: Positive for weight loss (She does note that she's lost quite a bit of weight with diet As even though she still is obese.).  HENT: Negative for congestion and nosebleeds.   Eyes: Negative for blurred vision.  Respiratory: Negative for shortness of breath.   Gastrointestinal: Negative for abdominal pain, blood in stool, heartburn and melena.  Genitourinary: Negative for flank pain and hematuria.  Musculoskeletal: Negative for joint pain and myalgias.  Neurological: Negative for dizziness.  Psychiatric/Behavioral: Negative for depression. The patient is nervous/anxious and has insomnia.   All other systems reviewed and are negative.  I have reviewed and (if needed) personally updated the patient's problem list, medications, allergies, past medical and surgical history, social and family history.   Past Medical History:  Diagnosis Date  . Hypertension   . Thyroid disease     Past Surgical History:  Procedure Laterality Date  . CHOLECYSTECTOMY    . TUBAL LIGATION      Current Meds  Medication Sig  . aspirin EC 81 MG tablet Take 81  mg by mouth daily.  . Cholecalciferol (VITAMIN D PO) Take 1 capsule by mouth daily.  Marland Kitchen levothyroxine (SYNTHROID, LEVOTHROID) 125 MCG tablet Take 125 mcg by mouth every morning.   . topiramate (TOPAMAX) 50 MG tablet Take 1 tablet in morning and 2 tablets at bedtime. (Patient taking differently: Take 50-100 mg by mouth 2 (two) times daily. Take 1 tablet in morning and 2 tablets at bedtime.)  . valACYclovir (VALTREX) 500 MG tablet Take 2,000 mg by mouth daily as needed.    Allergies  Allergen Reactions  . Codeine Hives, Itching and Other (See Comments)     burning  . Gadolinium Derivatives Hives    Patient had a few hives that were mildly itchy. Checked by dr Carlis Abbott, since severity was so low, and patient had no driver, he recommended she take benadryl at home. She remained in office for 40 minutes post injection to be observed.     Social History   Socioeconomic History  . Marital status: Legally Separated    Spouse name: None  . Number of children: 3  . Years of education: None  . Highest education level: None  Social Needs  . Financial resource strain: None  . Food insecurity - worry: None  . Food insecurity - inability: None  . Transportation needs - medical: None  . Transportation needs - non-medical: None  Occupational History  . None  Tobacco Use  . Smoking status: Never Smoker  . Smokeless tobacco: Never Used  Substance and Sexual Activity  . Alcohol use: No  . Drug use: No  . Sexual activity: None  Other Topics Concern  . None  Social History Narrative   She currently works at a [employer], building tires   She never smoked. Does not do routine exercise, but has recently started adjusting her diet.    family history includes Aneurysm in her maternal aunt; Arthritis in her maternal grandmother; CAD in her father; Heart attack (age of onset: 66) in her father; Hypertension in her maternal grandmother and mother; Hypothyroidism in her mother, sister, and sister; Seizures in her maternal aunt; Stroke in her mother.  Wt Readings from Last 3 Encounters:  07/06/17 212 lb (96.2 kg)  07/01/17 200 lb (90.7 kg)  06/25/17 204 lb (92.5 kg)    PHYSICAL EXAM BP 126/78   Pulse 76   Ht 5\' 5"  (1.651 m)   Wt 212 lb (96.2 kg)   LMP 06/17/2017   SpO2 98%   BMI 35.28 kg/m  Physical Exam  Constitutional: She is oriented to person, place, and time. She appears well-developed and well-nourished. No distress.  Mildly obese, but healthy-appearing. Well-groomed  HENT:  Head: Normocephalic and atraumatic.  Mouth/Throat: No  oropharyngeal exudate.  Neck: Normal range of motion. Neck supple. No hepatojugular reflux and no JVD present. Carotid bruit is not present.  Cardiovascular: Normal rate, regular rhythm, normal heart sounds and intact distal pulses.  Occasional extrasystoles are present. PMI is not displaced. Exam reveals no gallop and no friction rub.  No murmur heard. Pulmonary/Chest: Effort normal and breath sounds normal. No respiratory distress. She has no wheezes. She has no rales.  Abdominal: Soft. Bowel sounds are normal. She exhibits no distension. There is no tenderness.  Musculoskeletal: Normal range of motion. She exhibits no edema (This puffiness in the legs, not true edema) or deformity.  Neurological: She is alert and oriented to person, place, and time. No cranial nerve deficit. She exhibits normal muscle tone.  Skin: Skin is  warm and dry. No rash noted. No erythema.  Psychiatric: She has a normal mood and affect. Her behavior is normal. Judgment and thought content normal.  Nursing note and vitals reviewed.    Adult ECG Report No new EKG - ER EKGs reviewed - no acute findings.  Other studies Reviewed: Additional studies/ records that were reviewed today include:  Recent Labs:   Lab Results  Component Value Date   CREATININE 0.88 07/01/2017   BUN 13 07/01/2017   NA 137 07/01/2017   K 4.2 07/01/2017   CL 106 07/01/2017   CO2 25 07/01/2017   No results found for: CHOL, HDL, LDLCALC, LDLDIRECT, TRIG, CHOLHDL   ASSESSMENT / PLAN:  She still having chest pain episodes that are prolonged and only occur at rest.  Very low likelihood that she has active CAD, but could have some spasm.  Most likely the symptoms are probably related to costochondritis. Plan:   SL NTG PRN along with amlodipine 2.5 mg daily for possible coronary spasm.  Prednisone taper for possible costochondritis  Rapid heartbeats: Continue to follow-up monitor -since this is due to return in next Wednesday.  We will  write her a note to return to work after during the monitor in next Wednesday.  Blood pressure is well controlled.  Continue current meds.  Should be able to tolerate amlodipine at low-dose.   Problem List Items Addressed This Visit    Chest pain with moderate risk for cardiac etiology   Essential hypertension - Primary (Chronic)   Relevant Medications   nitroGLYCERIN (NITROSTAT) 0.4 MG SL tablet   amLODipine (NORVASC) 2.5 MG tablet   Family history of premature coronary artery disease (Chronic)   Racing heart beat      Current medicines are reviewed at length with the patient today. (+/- concerns) n/a The following changes have been made:See below  Patient Instructions  Medication instructions   -- start Amlodipine 2.5 mg one tablet daily --as needed nitroglycerin .4 mg sublingual every 5 min up to 3 times each episodes  -- use prednisone tablets as Dr Ellyn Hack instructed.    Okay to return to work after you have completed wearing the monitor.    Your physician recommends that you schedule a follow-up appointment in JAN 2019   Nitroglycerin sublingual tablets What is this medicine? NITROGLYCERIN (nye troe GLI ser in) is a type of vasodilator. It relaxes blood vessels, increasing the blood and oxygen supply to your heart. This medicine is used to relieve chest pain caused by angina. It is also used to prevent chest pain before activities like climbing stairs, going outdoors in cold weather, or sexual activity. This medicine may be used for other purposes; ask your health care provider or pharmacist if you have questions. COMMON BRAND NAME(S): Nitroquick, Nitrostat, Nitrotab What should I tell my health care provider before I take this medicine? They need to know if you have any of these conditions: -anemia -head injury, recent stroke, or bleeding in the brain -liver disease -previous heart attack -an unusual or allergic reaction to nitroglycerin, other medicines,  foods, dyes, or preservatives -pregnant or trying to get pregnant -breast-feeding How should I use this medicine? Take this medicine by mouth as needed. At the first sign of an angina attack (chest pain or tightness) place one tablet under your tongue. You can also take this medicine 5 to 10 minutes before an event likely to produce chest pain. Follow the directions on the prescription label. Let the tablet dissolve under  the tongue. Do not swallow whole. Replace the dose if you accidentally swallow it. It will help if your mouth is not dry. Saliva around the tablet will help it to dissolve more quickly. Do not eat or drink, smoke or chew tobacco while a tablet is dissolving. If you are not better within 5 minutes after taking ONE dose of nitroglycerin, call 9-1-1 immediately to seek emergency medical care. Do not take more than 3 nitroglycerin tablets over 15 minutes. If you take this medicine often to relieve symptoms of angina, your doctor or health care professional may provide you with different instructions to manage your symptoms. If symptoms do not go away after following these instructions, it is important to call 9-1-1 immediately. Do not take more than 3 nitroglycerin tablets over 15 minutes. Talk to your pediatrician regarding the use of this medicine in children. Special care may be needed. Overdosage: If you think you have taken too much of this medicine contact a poison control center or emergency room at once. NOTE: This medicine is only for you. Do not share this medicine with others. What if I miss a dose? This does not apply. This medicine is only used as needed. What may interact with this medicine? Do not take this medicine with any of the following medications: -certain migraine medicines like ergotamine and dihydroergotamine (DHE) -medicines used to treat erectile dysfunction like sildenafil, tadalafil, and vardenafil -riociguat This medicine may also interact with the  following medications: -alteplase -aspirin -heparin -medicines for high blood pressure -medicines for mental depression -other medicines used to treat angina -phenothiazines like chlorpromazine, mesoridazine, prochlorperazine, thioridazine This list may not describe all possible interactions. Give your health care provider a list of all the medicines, herbs, non-prescription drugs, or dietary supplements you use. Also tell them if you smoke, drink alcohol, or use illegal drugs. Some items may interact with your medicine. What should I watch for while using this medicine? Tell your doctor or health care professional if you feel your medicine is no longer working. Keep this medicine with you at all times. Sit or lie down when you take your medicine to prevent falling if you feel dizzy or faint after using it. Try to remain calm. This will help you to feel better faster. If you feel dizzy, take several deep breaths and lie down with your feet propped up, or bend forward with your head resting between your knees. You may get drowsy or dizzy. Do not drive, use machinery, or do anything that needs mental alertness until you know how this drug affects you. Do not stand or sit up quickly, especially if you are an older patient. This reduces the risk of dizzy or fainting spells. Alcohol can make you more drowsy and dizzy. Avoid alcoholic drinks. Do not treat yourself for coughs, colds, or pain while you are taking this medicine without asking your doctor or health care professional for advice. Some ingredients may increase your blood pressure. What side effects may I notice from receiving this medicine? Side effects that you should report to your doctor or health care professional as soon as possible: -blurred vision -dry mouth -skin rash -sweating -the feeling of extreme pressure in the head -unusually weak or tired Side effects that usually do not require medical attention (report to your doctor or  health care professional if they continue or are bothersome): -flushing of the face or neck -headache -irregular heartbeat, palpitations -nausea, vomiting This list may not describe all possible side effects. Call your  doctor for medical advice about side effects. You may report side effects to FDA at 1-800-FDA-1088. Where should I keep my medicine? Keep out of the reach of children. Store at room temperature between 20 and 25 degrees C (68 and 77 degrees F). Store in [employer]. Protect from light and moisture. Keep tightly closed. Throw away any unused medicine after the expiration date. NOTE: This sheet is a summary. It may not cover all possible information. If you have questions about this medicine, talk to your doctor, pharmacist, or health care provider.  2018 Elsevier/Gold Standard (2013-06-12 17:57:36)    Studies Ordered:   No orders of the defined types were placed in this encounter.     Glenetta Hew, M.D., M.S. Interventional Cardiologist   Pager # [phone] Phone # [phone] [address] [place], [place] [address]

## 2017-08-14 ENCOUNTER — Ambulatory Visit: Payer: BLUE CROSS/BLUE SHIELD | Admitting: Cardiology

## 2017-09-19 ENCOUNTER — Ambulatory Visit: Payer: BLUE CROSS/BLUE SHIELD | Admitting: Cardiology

## 2017-09-19 ENCOUNTER — Encounter: Payer: Self-pay | Admitting: Cardiology

## 2017-09-19 VITALS — BP 138/92 | HR 64 | Ht 65.0 in | Wt 213.0 lb

## 2017-09-19 DIAGNOSIS — R079 Chest pain, unspecified: Secondary | ICD-10-CM

## 2017-09-19 DIAGNOSIS — R0609 Other forms of dyspnea: Secondary | ICD-10-CM | POA: Insufficient documentation

## 2017-09-19 DIAGNOSIS — I10 Essential (primary) hypertension: Secondary | ICD-10-CM

## 2017-09-19 DIAGNOSIS — R000 Tachycardia, unspecified: Secondary | ICD-10-CM

## 2017-09-19 DIAGNOSIS — Z8249 Family history of ischemic heart disease and other diseases of the circulatory system: Secondary | ICD-10-CM

## 2017-09-19 MED ORDER — AMLODIPINE BESYLATE 5 MG PO TABS: 5.0000 mg | ORAL_TABLET | Freq: Every day | ORAL | 11 refills | Status: DC

## 2017-09-19 NOTE — H&P (View-Only) (Signed)
PCP: Sharilyn Sites, MD  Clinic Note: Chief Complaint  Patient presents with  . Follow-up    1-2 months  . Chest Pain  . Headache  . Shortness of Breath    HPI: Tammie Powell is a 47 y.o. female who is being seen today for the evaluation of chest pain (ER visit) at the request of Sharilyn Sites, MD.  Tammie Powell was last seen on 2 separate occasions at the emergency room for chest discomfort.  Recent Hospitalizations:  Jan 13 ([place]) -  O/n - r/o MI.  Neg GXT  Studies Personally Reviewed - (if available, images/films reviewed: From Epic Chart or Care Everywhere)  Coronary calcium score of 7. This was 2 percentile for age and sex matched control.  Monitor (Nov 2018) -essentially normal monitor with mostly sinus rhythm.  Very rare premature beats noted.  No arrhythmia.  GXT 09/10/17 [facility]. -Negative for ischemia.  Interval History: Tammie Powell returns today indicating that she has had at a couple more episodes of chest pain with the most recent one taking her to [facility] --> admitted & r/o MI.  Negative GXT.  Interestingly, again the symptoms were really not exertional in nature.  She has more exertional dyspnea. In addition to evaluating her cardiac function, she had a thyroid level checked and her medications were adjusted. She tells me that after her last visit, she was able to control most of her episodes of chest pain with taking a nitroglycerin if they occurred and they resolved.  The time when she went to [facility], the pain did not resolve after nitroglycerin and that is why she went to the hospital.  She still has occasional palpitations but not to the extent that she had before. Mostly what she notes now is feeling short of breath with activity or exertion.  Occasionally has some orthopnea but no real PND or edema.  Remainder of cardiac review of symptoms: No weakness or syncope/near syncope. No TIA/amaurosis fugax symptoms. No  claudication.  ROS: A comprehensive was performed. Review of Systems  Constitutional: Negative for weight loss (Unfortunately, she seems to have put back on some weight that she had lost.).  HENT: Negative for congestion and nosebleeds.   Eyes: Negative for blurred vision.  Respiratory: Negative for shortness of breath.   Gastrointestinal: Negative for abdominal pain, blood in stool, heartburn and melena.  Genitourinary: Negative for flank pain and hematuria.  Musculoskeletal: Negative for joint pain and myalgias.  Neurological: Positive for headaches. Negative for dizziness.  Psychiatric/Behavioral: Negative for depression. The patient is nervous/anxious and has insomnia.   All other systems reviewed and are negative.  I have reviewed and (if needed) personally updated the patient's problem list, medications, allergies, past medical and surgical history, social and family history.   Past Medical History:  Diagnosis Date  . Hypertension   . Thyroid disease     Past Surgical History:  Procedure Laterality Date  . CHOLECYSTECTOMY    . TUBAL LIGATION    Coronary calcium score of 7. This was 74 percentile for age and sex matched control.  Current Meds  Medication Sig  . aspirin EC 81 MG tablet Take 81 mg by mouth daily.  . Cholecalciferol (VITAMIN D PO) Take 1 capsule by mouth daily.  Marland Kitchen levothyroxine (SYNTHROID) 137 MCG tablet Take 137 mcg by mouth daily before breakfast.  . nitroGLYCERIN (NITROSTAT) 0.4 MG SL tablet Place 1 tablet (0.4 mg total) every 5 (five) minutes as needed under the tongue  for chest pain.  Marland Kitchen topiramate (TOPAMAX) 50 MG tablet Take 1 tablet in morning and 2 tablets at bedtime. (Patient taking differently: Take 50-100 mg by mouth 2 (two) times daily. Take 1 tablet in morning and 2 tablets at bedtime.)  . valACYclovir (VALTREX) 500 MG tablet Take 2,000 mg by mouth daily as needed.  . [DISCONTINUED] amLODipine (NORVASC) 2.5 MG tablet Take 1 tablet (2.5 mg total)  daily by mouth.    Allergies  Allergen Reactions  . Codeine Hives, Itching and Other (See Comments)    burning  . Gadolinium Derivatives Hives    Patient had a few hives that were mildly itchy. Checked by dr Carlis Abbott, since severity was so low, and patient had no driver, he recommended she take benadryl at home. She remained in office for 40 minutes post injection to be observed.     Social History   Socioeconomic History  . Marital status: Legally Separated    Spouse name: None  . Number of children: 3  . Years of education: None  . Highest education level: None  Social Needs  . Financial resource strain: None  . Food insecurity - worry: None  . Food insecurity - inability: None  . Transportation needs - medical: None  . Transportation needs - non-medical: None  Occupational History  . None  Tobacco Use  . Smoking status: Never Smoker  . Smokeless tobacco: Never Used  Substance and Sexual Activity  . Alcohol use: No  . Drug use: No  . Sexual activity: None  Other Topics Concern  . None  Social History Narrative   She currently works at a [employer], building tires   She never smoked. Does not do routine exercise, but has recently started adjusting her diet.    family history includes Aneurysm in her maternal aunt; Arthritis in her maternal grandmother; CAD in her father; Heart attack (age of onset: 15) in her father; Hypertension in her maternal grandmother and mother; Hypothyroidism in her mother, sister, and sister; Seizures in her maternal aunt; Stroke in her mother.  Wt Readings from Last 3 Encounters:  09/19/17 213 lb (96.6 kg)  07/06/17 212 lb (96.2 kg)  07/01/17 200 lb (90.7 kg)    PHYSICAL EXAM BP (!) 138/92   Pulse 64   Ht 5\' 5"  (1.651 m)   Wt 213 lb (96.6 kg)   BMI 35.45 kg/m  Physical Exam  Constitutional: She is oriented to person, place, and time. She appears well-developed and well-nourished. No distress.  Mildly obese, but healthy-appearing.  Well-groomed  HENT:  Head: Normocephalic and atraumatic.  Mouth/Throat: No oropharyngeal exudate.  Neck: Normal range of motion. Neck supple. No hepatojugular reflux and no JVD present. Carotid bruit is not present.  Cardiovascular: Normal rate, regular rhythm, normal heart sounds and intact distal pulses.  Occasional extrasystoles are present. PMI is not displaced. Exam reveals no gallop and no friction rub.  No murmur heard. Pulmonary/Chest: Effort normal and breath sounds normal. No respiratory distress. She has no wheezes. She has no rales.  Abdominal: Soft. Bowel sounds are normal. She exhibits no distension. There is no tenderness.  Musculoskeletal: Normal range of motion. She exhibits no edema (This puffiness in the legs, not true edema).  Neurological: She is alert and oriented to person, place, and time.  Skin: Skin is warm and dry. No rash noted. No erythema.  Psychiatric: She has a normal mood and affect. Her behavior is normal. Judgment and thought content normal.  Nursing note and  vitals reviewed.    Adult ECG Report No new EKG - ER EKGs reviewed - no acute findings.  Other studies Reviewed: Additional studies/ records that were reviewed today include:  Recent Labs:   Lab Results  Component Value Date   CREATININE 0.88 07/01/2017   BUN 13 07/01/2017   NA 137 07/01/2017   K 4.2 07/01/2017   CL 106 07/01/2017   CO2 25 07/01/2017   No results found for: CHOL, HDL, LDLCALC, LDLDIRECT, TRIG, CHOLHDL   ASSESSMENT / PLAN:  She has had continued episodes of chest pain usually relieved with nitroglycerin.  She has now ruled out for MI on each occasion.  She had relatively low risk coronary calcium score findings and most recently with a negative GXT.    She never did use steroids, and the chest discomfort is probably not related to costochondritis/pericarditis.   Problem List Items Addressed This Visit    Chest pain with moderate risk for cardiac etiology    Still  typical and atypical features.  Relatively normal coronary calcium score and negative GXT.  Cannot exclude spasm, but fixed coronary lesion is less likely.  Plan:   Still cannot exclude coronary spasm. ->  Continue as needed nitroglycerin along with amlodipine which will increase to 5 mg daily.    If symptoms were to persist, would check imaging stress test -> treadmill Myoview.  Change aspirin to 4 x 81 mg tablets as needed chest pain        Relevant Orders   EKG 12-Lead (Completed)   ECHOCARDIOGRAM COMPLETE   DOE (dyspnea on exertion)    She has not had an evaluation of her cardiac function.  Will check 2D echocardiogram to evaluate for any signs of heart failure systolic or diastolic. -   Plan: Check 2D echo      Relevant Orders   EKG 12-Lead (Completed)   ECHOCARDIOGRAM COMPLETE   Essential hypertension - Primary (Chronic)    Blood pressure is borderline.  Will increase amlodipine to 5 mg daily.      Relevant Medications   amLODipine (NORVASC) 5 MG tablet   Family history of premature coronary artery disease (Chronic)    Relatively reassuring results on coronary calcium score. Also had a negative GXT. -Interestingly, she did not have chest pain during the GXT.  This would make fixed coronary lesion less likely.      Relevant Orders   ECHOCARDIOGRAM COMPLETE   Racing heart beat    No significant findings on monitor.  Continue to follow symptoms.         Current medicines are reviewed at length with the patient today. (+/- concerns) n/a The following changes have been made:See below  Patient Instructions  MEDICATION INSTRUCTIONS  -- INCREASE DOSE OF AMLODIPINE TO 5 MG DAILY  ( NEW PRESCRIPTION SENT TO PHARMACY)  --- TAKE ASPIRIN 81 MG ON AN AS NEEDED BASIS - TAKE  4 TABLETS FOR CHEST PAIN  DURING  AND THE MORNING AFTER .   SCHEDULE AT [place] [facility] has requested that you have an echocardiogram. Echocardiography is a  painless test that uses sound waves to create images of your heart. It provides your doctor with information about the size and shape of your heart and how well your heart's chambers and valves are working. This procedure takes approximately one hour. There are no restrictions for this procedure.    Your physician wants you to follow-up in [place].  You will receive a reminder letter in the mail two months in advance. If you don't receive a letter, please call our office to schedule the follow-up appointment.    If you need a refill on your cardiac medications before your next appointment, please call your pharmacy.     Studies Ordered:   Orders Placed This Encounter  Procedures  . EKG 12-Lead  . ECHOCARDIOGRAM COMPLETE      Glenetta Hew, M.D., M.S. Interventional Cardiologist   Pager # [phone] Phone # [phone] [address] [place], [place] [address]

## 2017-09-19 NOTE — Progress Notes (Signed)
PCP: Tammie Sites, MD  Clinic Note: Chief Complaint  Patient presents with  . Follow-up    1-2 months  . Chest Pain  . Headache  . Shortness of Breath    HPI: Tammie Powell is a 47 y.o. female who is being seen today for the evaluation of chest pain (ER visit) at the request of Tammie Sites, MD.  Tammie Powell was last seen on 2 separate occasions at the emergency room for chest discomfort.  Recent Hospitalizations:  Jan 13 ([place]) -  O/n - r/o MI.  Neg GXT  Studies Personally Reviewed - (if available, images/films reviewed: From Epic Chart or Care Everywhere)  Coronary calcium score of 7. This was 40 percentile for age and sex matched control.  Monitor (Nov 2018) -essentially normal monitor with mostly sinus rhythm.  Very rare premature beats noted.  No arrhythmia.  GXT 09/10/17 [facility]. -Negative for ischemia.  Interval History: Tammie Powell returns today indicating that she has had at a couple more episodes of chest pain with the most recent one taking her to [facility] --> admitted & r/o MI.  Negative GXT.  Interestingly, again the symptoms were really not exertional in nature.  She has more exertional dyspnea. In addition to evaluating her cardiac function, she had a thyroid level checked and her medications were adjusted. She tells me that after her last visit, she was able to control most of her episodes of chest pain with taking a nitroglycerin if they occurred and they resolved.  The time when she went to [facility], the pain did not resolve after nitroglycerin and that is why she went to the hospital.  She still has occasional palpitations but not to the extent that she had before. Mostly what she notes now is feeling short of breath with activity or exertion.  Occasionally has some orthopnea but no real PND or edema.  Remainder of cardiac review of symptoms: No weakness or syncope/near syncope. No TIA/amaurosis fugax symptoms. No  claudication.  ROS: A comprehensive was performed. Review of Systems  Constitutional: Negative for weight loss (Unfortunately, she seems to have put back on some weight that she had lost.).  HENT: Negative for congestion and nosebleeds.   Eyes: Negative for blurred vision.  Respiratory: Negative for shortness of breath.   Gastrointestinal: Negative for abdominal pain, blood in stool, heartburn and melena.  Genitourinary: Negative for flank pain and hematuria.  Musculoskeletal: Negative for joint pain and myalgias.  Neurological: Positive for headaches. Negative for dizziness.  Psychiatric/Behavioral: Negative for depression. The patient is nervous/anxious and has insomnia.   All other systems reviewed and are negative.  I have reviewed and (if needed) personally updated the patient's problem list, medications, allergies, past medical and surgical history, social and family history.   Past Medical History:  Diagnosis Date  . Hypertension   . Thyroid disease     Past Surgical History:  Procedure Laterality Date  . CHOLECYSTECTOMY    . TUBAL LIGATION    Coronary calcium score of 7. This was 75 percentile for age and sex matched control.  Current Meds  Medication Sig  . aspirin EC 81 MG tablet Take 81 mg by mouth daily.  . Cholecalciferol (VITAMIN D PO) Take 1 capsule by mouth daily.  Marland Kitchen levothyroxine (SYNTHROID) 137 MCG tablet Take 137 mcg by mouth daily before breakfast.  . nitroGLYCERIN (NITROSTAT) 0.4 MG SL tablet Place 1 tablet (0.4 mg total) every 5 (five) minutes as needed under the tongue  for chest pain.  Marland Kitchen topiramate (TOPAMAX) 50 MG tablet Take 1 tablet in morning and 2 tablets at bedtime. (Patient taking differently: Take 50-100 mg by mouth 2 (two) times daily. Take 1 tablet in morning and 2 tablets at bedtime.)  . valACYclovir (VALTREX) 500 MG tablet Take 2,000 mg by mouth daily as needed.  . [DISCONTINUED] amLODipine (NORVASC) 2.5 MG tablet Take 1 tablet (2.5 mg total)  daily by mouth.    Allergies  Allergen Reactions  . Codeine Hives, Itching and Other (See Comments)    burning  . Gadolinium Derivatives Hives    Patient had a few hives that were mildly itchy. Checked by dr Tammie Powell, since severity was so low, and patient had no driver, he recommended she take benadryl at home. She remained in office for 40 minutes post injection to be observed.     Social History   Socioeconomic History  . Marital status: Legally Separated    Spouse name: None  . Number of children: 3  . Years of education: None  . Highest education level: None  Social Needs  . Financial resource strain: None  . Food insecurity - worry: None  . Food insecurity - inability: None  . Transportation needs - medical: None  . Transportation needs - non-medical: None  Occupational History  . None  Tobacco Use  . Smoking status: Never Smoker  . Smokeless tobacco: Never Used  Substance and Sexual Activity  . Alcohol use: No  . Drug use: No  . Sexual activity: None  Other Topics Concern  . None  Social History Narrative   She currently works at a [employer], building tires   She never smoked. Does not do routine exercise, but has recently started adjusting her diet.    family history includes Aneurysm in her maternal aunt; Arthritis in her maternal grandmother; CAD in her father; Heart attack (age of onset: 59) in her father; Hypertension in her maternal grandmother and mother; Hypothyroidism in her mother, sister, and sister; Seizures in her maternal aunt; Stroke in her mother.  Wt Readings from Last 3 Encounters:  09/19/17 213 lb (96.6 kg)  07/06/17 212 lb (96.2 kg)  07/01/17 200 lb (90.7 kg)    PHYSICAL EXAM BP (!) 138/92   Pulse 64   Ht 5\' 5"  (1.651 m)   Wt 213 lb (96.6 kg)   BMI 35.45 kg/m  Physical Exam  Constitutional: She is oriented to person, place, and time. She appears well-developed and well-nourished. No distress.  Mildly obese, but healthy-appearing.  Well-groomed  HENT:  Head: Normocephalic and atraumatic.  Mouth/Throat: No oropharyngeal exudate.  Neck: Normal range of motion. Neck supple. No hepatojugular reflux and no JVD present. Carotid bruit is not present.  Cardiovascular: Normal rate, regular rhythm, normal heart sounds and intact distal pulses.  Occasional extrasystoles are present. PMI is not displaced. Exam reveals no gallop and no friction rub.  No murmur heard. Pulmonary/Chest: Effort normal and breath sounds normal. No respiratory distress. She has no wheezes. She has no rales.  Abdominal: Soft. Bowel sounds are normal. She exhibits no distension. There is no tenderness.  Musculoskeletal: Normal range of motion. She exhibits no edema (This puffiness in the legs, not true edema).  Neurological: She is alert and oriented to person, place, and time.  Skin: Skin is warm and dry. No rash noted. No erythema.  Psychiatric: She has a normal mood and affect. Her behavior is normal. Judgment and thought content normal.  Nursing note and  vitals reviewed.    Adult ECG Report No new EKG - ER EKGs reviewed - no acute findings.  Other studies Reviewed: Additional studies/ records that were reviewed today include:  Recent Labs:   Lab Results  Component Value Date   CREATININE 0.88 07/01/2017   BUN 13 07/01/2017   NA 137 07/01/2017   K 4.2 07/01/2017   CL 106 07/01/2017   CO2 25 07/01/2017   No results found for: CHOL, HDL, LDLCALC, LDLDIRECT, TRIG, CHOLHDL   ASSESSMENT / PLAN:  She has had continued episodes of chest pain usually relieved with nitroglycerin.  She has now ruled out for MI on each occasion.  She had relatively low risk coronary calcium score findings and most recently with a negative GXT.    She never did use steroids, and the chest discomfort is probably not related to costochondritis/pericarditis.   Problem List Items Addressed This Visit    Chest pain with moderate risk for cardiac etiology    Still  typical and atypical features.  Relatively normal coronary calcium score and negative GXT.  Cannot exclude spasm, but fixed coronary lesion is less likely.  Plan:   Still cannot exclude coronary spasm. ->  Continue as needed nitroglycerin along with amlodipine which will increase to 5 mg daily.    If symptoms were to persist, would check imaging stress test -> treadmill Myoview.  Change aspirin to 4 x 81 mg tablets as needed chest pain        Relevant Orders   EKG 12-Lead (Completed)   ECHOCARDIOGRAM COMPLETE   DOE (dyspnea on exertion)    She has not had an evaluation of her cardiac function.  Will check 2D echocardiogram to evaluate for any signs of heart failure systolic or diastolic. -   Plan: Check 2D echo      Relevant Orders   EKG 12-Lead (Completed)   ECHOCARDIOGRAM COMPLETE   Essential hypertension - Primary (Chronic)    Blood pressure is borderline.  Will increase amlodipine to 5 mg daily.      Relevant Medications   amLODipine (NORVASC) 5 MG tablet   Family history of premature coronary artery disease (Chronic)    Relatively reassuring results on coronary calcium score. Also had a negative GXT. -Interestingly, she did not have chest pain during the GXT.  This would make fixed coronary lesion less likely.      Relevant Orders   ECHOCARDIOGRAM COMPLETE   Racing heart beat    No significant findings on monitor.  Continue to follow symptoms.         Current medicines are reviewed at length with the patient today. (+/- concerns) n/a The following changes have been made:See below  Patient Instructions  MEDICATION INSTRUCTIONS  -- INCREASE DOSE OF AMLODIPINE TO 5 MG DAILY  ( NEW PRESCRIPTION SENT TO PHARMACY)  --- TAKE ASPIRIN 81 MG ON AN AS NEEDED BASIS - TAKE  4 TABLETS FOR CHEST PAIN  DURING  AND THE MORNING AFTER .   SCHEDULE AT [facility] has requested that you have an echocardiogram. Echocardiography is a  painless test that uses sound waves to create images of your heart. It provides your doctor with information about the size and shape of your heart and how well your heart's chambers and valves are working. This procedure takes approximately one hour. There are no restrictions for this procedure.    Your physician wants you to follow-up in [place].  You will receive a reminder letter in the mail two months in advance. If you don't receive a letter, please call our office to schedule the follow-up appointment.    If you need a refill on your cardiac medications before your next appointment, please call your pharmacy.     Studies Ordered:   Orders Placed This Encounter  Procedures  . EKG 12-Lead  . ECHOCARDIOGRAM COMPLETE      Glenetta Hew, M.D., M.S. Interventional Cardiologist   Pager # [phone] Phone # [phone] [address] [place],  [address]

## 2017-09-19 NOTE — Patient Instructions (Addendum)
MEDICATION INSTRUCTIONS  -- INCREASE DOSE OF AMLODIPINE TO 5 MG DAILY  ( NEW PRESCRIPTION SENT TO PHARMACY)  --- TAKE ASPIRIN 81 MG ON AN AS NEEDED BASIS - TAKE  4 TABLETS FOR CHEST PAIN  DURING  AND THE MORNING AFTER .   SCHEDULE AT [place] has requested that you have an echocardiogram. Echocardiography is a painless test that uses sound waves to create images of your heart. It provides your doctor with information about the size and shape of your heart and how well your heart's chambers and valves are working. This procedure takes approximately one hour. There are no restrictions for this procedure.    Your physician wants you to follow-up in [place]. You will receive a reminder letter in the mail two months in advance. If you don't receive a letter, please call our office to schedule the follow-up appointment.    If you need a refill on your cardiac medications before your next appointment, please call your pharmacy.

## 2017-09-20 ENCOUNTER — Encounter: Payer: Self-pay | Admitting: Cardiology

## 2017-09-20 NOTE — Assessment & Plan Note (Signed)
She has not had an evaluation of her cardiac function.  Will check 2D echocardiogram to evaluate for any signs of heart failure systolic or diastolic. -   Plan: Check 2D echo

## 2017-09-20 NOTE — Assessment & Plan Note (Signed)
Blood pressure is borderline.  Will increase amlodipine to 5 mg daily.

## 2017-09-20 NOTE — Assessment & Plan Note (Signed)
Still typical and atypical features.  Relatively normal coronary calcium score and negative GXT.  Cannot exclude spasm, but fixed coronary lesion is less likely.  Plan:   Still cannot exclude coronary spasm. ->  Continue as needed nitroglycerin along with amlodipine which will increase to 5 mg daily.    If symptoms were to persist, would check imaging stress test -> treadmill Myoview.  Change aspirin to 4 x 81 mg tablets as needed chest pain

## 2017-09-20 NOTE — Assessment & Plan Note (Signed)
No significant findings on monitor.  Continue to follow symptoms.

## 2017-09-20 NOTE — Assessment & Plan Note (Signed)
Relatively reassuring results on coronary calcium score. Also had a negative GXT. -Interestingly, she did not have chest pain during the GXT.  This would make fixed coronary lesion less likely.

## 2017-09-26 ENCOUNTER — Other Ambulatory Visit (HOSPITAL_COMMUNITY): Payer: BLUE CROSS/BLUE SHIELD

## 2017-09-28 HISTORY — PX: TRANSTHORACIC ECHOCARDIOGRAM: SHX275

## 2017-10-04 ENCOUNTER — Other Ambulatory Visit: Payer: Self-pay

## 2017-10-04 ENCOUNTER — Ambulatory Visit (HOSPITAL_COMMUNITY): Payer: BLUE CROSS/BLUE SHIELD | Attending: Cardiovascular Disease

## 2017-10-04 DIAGNOSIS — R0609 Other forms of dyspnea: Secondary | ICD-10-CM | POA: Insufficient documentation

## 2017-10-04 DIAGNOSIS — Z8249 Family history of ischemic heart disease and other diseases of the circulatory system: Secondary | ICD-10-CM | POA: Diagnosis present

## 2017-10-04 DIAGNOSIS — R079 Chest pain, unspecified: Secondary | ICD-10-CM | POA: Insufficient documentation

## 2017-10-05 ENCOUNTER — Encounter: Payer: Self-pay | Admitting: Cardiology

## 2017-10-05 DIAGNOSIS — R0609 Other forms of dyspnea: Secondary | ICD-10-CM

## 2017-10-05 DIAGNOSIS — I200 Unstable angina: Secondary | ICD-10-CM

## 2017-10-05 DIAGNOSIS — R079 Chest pain, unspecified: Secondary | ICD-10-CM

## 2017-10-05 DIAGNOSIS — Z01818 Encounter for other preprocedural examination: Secondary | ICD-10-CM

## 2017-10-09 NOTE — Progress Notes (Unsigned)
Was notified by patient email that she has been having progressively more frequent episodes of dyspnea and chest discomfort.  At this point the option would be yet again another stress test versus proceeding with cardiac catheterization.  Her PCP had recommended cardiac catheterization and that seems reasonable for me.  Would plan right and left heart catheterization.  She is currently scheduled to see me on February 14, however if she is willing, does not necessarily need to come in on that schedule her for cardiac catheterization.  However she wants to come in to discuss this , we can have her continue to follow-up as scheduled.   Glenetta Hew, MD

## 2017-10-09 NOTE — Progress Notes (Deleted)
Was notified by patient email that she has been having progressively more frequent episodes of dyspnea and chest discomfort.  At this point the option would be yet again another stress test versus proceeding with cardiac catheterization.  Her PCP had recommended cardiac catheterization and that seems reasonable for me.  Would plan right and left heart catheterization.  She is currently scheduled to see me on February 14, however if she is willing, does not necessarily need to come in on that schedule her for cardiac catheterization.  However she wants to come in to discuss this , we can have her continue to follow-up as scheduled.   Glenetta Hew, MD

## 2017-10-09 NOTE — Telephone Encounter (Signed)
CALLED PATIENT . INFORMED PATIENT DR Ellyn Hack REVIEWED MYCHART MESSAGE - IF SHE IS IN AGREEMENT CAN DO CATH ON Oct 17 2017.   PATIENT IS IN AGREEMENT. AND STATES DR HARDING HAD DISCUSS CATH WITH HER AT PREVIOUS APPOINTMENT. SHE WOULD LIKE TO PROCEED WITHOUT APPOINTMENT ON 10/11/17.  PATIENT AWARE RN WILL GET BACK IN TOUCH WITH HER TO GIVE DETAILS ABOUT PROCEDURE.(RIGHT AND LEFT HEART  CATH

## 2017-10-11 ENCOUNTER — Telehealth: Payer: Self-pay | Admitting: *Deleted

## 2017-10-11 ENCOUNTER — Other Ambulatory Visit: Payer: Self-pay | Admitting: *Deleted

## 2017-10-11 ENCOUNTER — Ambulatory Visit: Payer: BLUE CROSS/BLUE SHIELD | Admitting: Cardiology

## 2017-10-11 ENCOUNTER — Encounter: Payer: Self-pay | Admitting: Cardiology

## 2017-10-11 DIAGNOSIS — R0609 Other forms of dyspnea: Secondary | ICD-10-CM

## 2017-10-11 DIAGNOSIS — Z01818 Encounter for other preprocedural examination: Secondary | ICD-10-CM

## 2017-10-11 DIAGNOSIS — I200 Unstable angina: Secondary | ICD-10-CM

## 2017-10-11 NOTE — Telephone Encounter (Signed)
SPOKE TO PATIENT . INSTRUCTION GIVEN TO PATIENT FOR Oct 17 2017 - RIGHT AND LEFT HEART CATH . WITH DR HARDING. ALL QUESTION WERE ANSWERED .  WRITTEN INSTRUCTION SENT BY MYCHART.  PATIENT AWARE WILL NEED LABS. WILL CALL IF ANY OTHER QUESTION ARISE. PATIENT VERBALIZED UNDERSTANDING.

## 2017-10-13 LAB — BASIC METABOLIC PANEL
BUN/Creatinine Ratio: 15 (ref 9–23)
BUN: 11 mg/dL (ref 6–24)
CALCIUM: 9.6 mg/dL (ref 8.7–10.2)
CO2: 22 mmol/L (ref 20–29)
CREATININE: 0.74 mg/dL (ref 0.57–1.00)
Chloride: 103 mmol/L (ref 96–106)
GFR, EST AFRICAN AMERICAN: 112 mL/min/{1.73_m2} (ref >59)
GFR, EST NON AFRICAN AMERICAN: 97 mL/min/{1.73_m2} (ref >59)
Glucose: 76 mg/dL (ref 65–99)
Potassium: 4.2 mmol/L (ref 3.5–5.2)
Sodium: 141 mmol/L (ref 134–144)

## 2017-10-13 LAB — CBC
HEMATOCRIT: 37.6 % (ref 34.0–46.6)
HEMOGLOBIN: 12.7 g/dL (ref 11.1–15.9)
MCH: 31 pg (ref 26.6–33.0)
MCHC: 33.8 g/dL (ref 31.5–35.7)
MCV: 92 fL (ref 79–97)
Platelets: 238 10*3/uL (ref 150–379)
RBC: 4.1 x10E6/uL (ref 3.77–5.28)
RDW: 13.3 % (ref 12.3–15.4)
WBC: 6.4 10*3/uL (ref 3.4–10.8)

## 2017-10-13 LAB — PROTIME-INR
INR: 1 (ref 0.8–1.2)
PROTHROMBIN TIME: 10 s (ref 9.1–12.0)

## 2017-10-16 ENCOUNTER — Telehealth: Payer: Self-pay | Admitting: *Deleted

## 2017-10-16 NOTE — Telephone Encounter (Signed)
Pt contacted pre-catheterization scheduled at [facility] for: Wednesday October 17, 2017 1PM Verified arrival time and place: [place] Entrance A/[place] at: 11AM Confirmed allergies in [place]. Confirmed no diabetes medications.  Verified AM meds can be  taken pre-cath with sip of water including: ASA 81 mg am of cath  Confirmed patient has responsible person to drive home post procedure and observe patient for 24 hours: yes

## 2017-10-17 ENCOUNTER — Encounter (HOSPITAL_COMMUNITY)
Admission: RE | Disposition: A | Discharge status: Home / Self Care | Payer: Self-pay | Source: Ambulatory Visit | Attending: Cardiology

## 2017-10-17 ENCOUNTER — Ambulatory Visit (HOSPITAL_COMMUNITY)
Admission: RE | Admit: 2017-10-17 | Discharge: 2017-10-17 | Disposition: A | Discharge status: Home / Self Care | Payer: BLUE CROSS/BLUE SHIELD | Source: Ambulatory Visit | Attending: Cardiology | Admitting: Cardiology

## 2017-10-17 ENCOUNTER — Encounter (HOSPITAL_COMMUNITY): Payer: Self-pay | Admitting: Cardiology

## 2017-10-17 DIAGNOSIS — I10 Essential (primary) hypertension: Secondary | ICD-10-CM | POA: Insufficient documentation

## 2017-10-17 DIAGNOSIS — Z79899 Other long term (current) drug therapy: Secondary | ICD-10-CM | POA: Insufficient documentation

## 2017-10-17 DIAGNOSIS — I209 Angina pectoris, unspecified: Secondary | ICD-10-CM | POA: Diagnosis not present

## 2017-10-17 DIAGNOSIS — Z823 Family history of stroke: Secondary | ICD-10-CM | POA: Insufficient documentation

## 2017-10-17 DIAGNOSIS — Z6831 Body mass index (BMI) 31.0-31.9, adult: Secondary | ICD-10-CM | POA: Insufficient documentation

## 2017-10-17 DIAGNOSIS — Z885 Allergy status to narcotic agent status: Secondary | ICD-10-CM | POA: Insufficient documentation

## 2017-10-17 DIAGNOSIS — Z8249 Family history of ischemic heart disease and other diseases of the circulatory system: Secondary | ICD-10-CM | POA: Insufficient documentation

## 2017-10-17 DIAGNOSIS — R0609 Other forms of dyspnea: Secondary | ICD-10-CM | POA: Diagnosis present

## 2017-10-17 DIAGNOSIS — I25110 Atherosclerotic heart disease of native coronary artery with unstable angina pectoris: Secondary | ICD-10-CM

## 2017-10-17 DIAGNOSIS — Z820 Family history of epilepsy and other diseases of the nervous system: Secondary | ICD-10-CM | POA: Diagnosis not present

## 2017-10-17 DIAGNOSIS — G4700 Insomnia, unspecified: Secondary | ICD-10-CM | POA: Diagnosis not present

## 2017-10-17 DIAGNOSIS — I200 Unstable angina: Secondary | ICD-10-CM

## 2017-10-17 DIAGNOSIS — R51 Headache: Secondary | ICD-10-CM | POA: Insufficient documentation

## 2017-10-17 DIAGNOSIS — R079 Chest pain, unspecified: Secondary | ICD-10-CM | POA: Diagnosis present

## 2017-10-17 DIAGNOSIS — I340 Nonrheumatic mitral (valve) insufficiency: Secondary | ICD-10-CM | POA: Diagnosis not present

## 2017-10-17 DIAGNOSIS — Z01818 Encounter for other preprocedural examination: Secondary | ICD-10-CM

## 2017-10-17 DIAGNOSIS — Z888 Allergy status to other drugs, medicaments and biological substances status: Secondary | ICD-10-CM | POA: Diagnosis not present

## 2017-10-17 DIAGNOSIS — E079 Disorder of thyroid, unspecified: Secondary | ICD-10-CM | POA: Diagnosis not present

## 2017-10-17 DIAGNOSIS — Z7982 Long term (current) use of aspirin: Secondary | ICD-10-CM | POA: Diagnosis not present

## 2017-10-17 DIAGNOSIS — E669 Obesity, unspecified: Secondary | ICD-10-CM | POA: Insufficient documentation

## 2017-10-17 DIAGNOSIS — Z8261 Family history of arthritis: Secondary | ICD-10-CM | POA: Diagnosis not present

## 2017-10-17 DIAGNOSIS — R002 Palpitations: Secondary | ICD-10-CM | POA: Diagnosis not present

## 2017-10-17 DIAGNOSIS — F419 Anxiety disorder, unspecified: Secondary | ICD-10-CM | POA: Insufficient documentation

## 2017-10-17 DIAGNOSIS — Z8349 Family history of other endocrine, nutritional and metabolic diseases: Secondary | ICD-10-CM | POA: Insufficient documentation

## 2017-10-17 HISTORY — PX: RIGHT/LEFT HEART CATH AND CORONARY ANGIOGRAPHY: CATH118266

## 2017-10-17 LAB — POCT I-STAT 3, VENOUS BLOOD GAS (G3P V)
Acid-base deficit: 2 mmol/L (ref 0.0–2.0)
Acid-base deficit: 3 mmol/L — ABNORMAL HIGH (ref 0.0–2.0)
BICARBONATE: 23.4 mmol/L (ref 20.0–28.0)
Bicarbonate: 24.5 mmol/L (ref 20.0–28.0)
O2 Saturation: 72 %
O2 Saturation: 76 %
PCO2 VEN: 47 mmHg (ref 44.0–60.0)
PCO2 VEN: 44.1 mmHg (ref 44.0–60.0)
PH VEN: 7.325 (ref 7.250–7.430)
PH VEN: 7.333 (ref 7.250–7.430)
PO2 VEN: 41 mmHg (ref 32.0–45.0)
TCO2: 26 mmol/L (ref 22–32)
TCO2: 25 mmol/L (ref 22–32)
pO2, Ven: 43 mmHg (ref 32.0–45.0)

## 2017-10-17 LAB — POCT I-STAT 3, ART BLOOD GAS (G3+)
Acid-base deficit: 2 mmol/L (ref 0.0–2.0)
Bicarbonate: 23.5 mmol/L (ref 20.0–28.0)
O2 Saturation: 99 %
TCO2: 25 mmol/L (ref 22–32)
pCO2 arterial: 42.4 mmHg (ref 32.0–48.0)
pH, Arterial: 7.353 (ref 7.350–7.450)
pO2, Arterial: 146 mmHg — ABNORMAL HIGH (ref 83.0–108.0)

## 2017-10-17 SURGERY — RIGHT/LEFT HEART CATH AND CORONARY ANGIOGRAPHY
Anesthesia: LOCAL

## 2017-10-17 MED ORDER — SODIUM CHLORIDE 0.9% FLUSH: 3.0000 mL | INTRAVENOUS | Status: DC | PRN

## 2017-10-17 MED ORDER — ACETAMINOPHEN 325 MG PO TABS
ORAL_TABLET | ORAL | Status: AC
  Filled 2017-10-17: qty 2

## 2017-10-17 MED ORDER — FENTANYL CITRATE (PF) 100 MCG/2ML IJ SOLN
INTRAMUSCULAR | Status: AC
  Filled 2017-10-17: qty 2

## 2017-10-17 MED ORDER — HEPARIN (PORCINE) IN NACL 2-0.9 UNIT/ML-% IJ SOLN
INTRAMUSCULAR | Status: AC
  Filled 2017-10-17: qty 500

## 2017-10-17 MED ORDER — IOPAMIDOL (ISOVUE-370) INJECTION 76%
INTRAVENOUS | Status: AC
  Filled 2017-10-17: qty 100

## 2017-10-17 MED ORDER — MIDAZOLAM HCL 2 MG/2ML IJ SOLN
INTRAMUSCULAR | Status: DC | PRN
  Administered 2017-10-17: 1 mg via INTRAVENOUS

## 2017-10-17 MED ORDER — HEPARIN SODIUM (PORCINE) 1000 UNIT/ML IJ SOLN
INTRAMUSCULAR | Status: AC
  Filled 2017-10-17: qty 1

## 2017-10-17 MED ORDER — AMLODIPINE BESYLATE 10 MG PO TABS: 10.0000 mg | ORAL_TABLET | Freq: Every day | ORAL | Status: DC

## 2017-10-17 MED ORDER — FENTANYL CITRATE (PF) 100 MCG/2ML IJ SOLN
INTRAMUSCULAR | Status: DC | PRN
  Administered 2017-10-17: 25 ug via INTRAVENOUS

## 2017-10-17 MED ORDER — ONDANSETRON HCL 4 MG/2ML IJ SOLN
INTRAMUSCULAR | Status: AC
  Administered 2017-10-17: 4 mg via INTRAVENOUS
  Filled 2017-10-17: qty 2

## 2017-10-17 MED ORDER — LIDOCAINE HCL (PF) 1 % IJ SOLN
INTRAMUSCULAR | Status: DC | PRN
  Administered 2017-10-17: 5 mL

## 2017-10-17 MED ORDER — HEPARIN SODIUM (PORCINE) 1000 UNIT/ML IJ SOLN
INTRAMUSCULAR | Status: DC | PRN
  Administered 2017-10-17: 4500 [IU] via INTRAVENOUS

## 2017-10-17 MED ORDER — IOPAMIDOL (ISOVUE-370) INJECTION 76%
INTRAVENOUS | Status: DC | PRN
  Administered 2017-10-17: 103 mL via INTRA_ARTERIAL

## 2017-10-17 MED ORDER — SODIUM CHLORIDE 0.9 % IV SOLN: INTRAVENOUS | Status: DC

## 2017-10-17 MED ORDER — NITROGLYCERIN 1 MG/10 ML FOR IR/CATH LAB
INTRA_ARTERIAL | Status: AC
  Filled 2017-10-17: qty 10

## 2017-10-17 MED ORDER — VERAPAMIL HCL 2.5 MG/ML IV SOLN
INTRAVENOUS | Status: AC
  Filled 2017-10-17: qty 2

## 2017-10-17 MED ORDER — HEPARIN (PORCINE) IN NACL 2-0.9 UNIT/ML-% IJ SOLN
INTRAMUSCULAR | Status: AC | PRN
  Administered 2017-10-17 (×2): 500 mL via INTRA_ARTERIAL

## 2017-10-17 MED ORDER — NITROGLYCERIN 1 MG/10 ML FOR IR/CATH LAB
INTRA_ARTERIAL | Status: DC | PRN
  Administered 2017-10-17 (×2): 200 ug via INTRACORONARY

## 2017-10-17 MED ORDER — VERAPAMIL HCL 2.5 MG/ML IV SOLN
INTRAVENOUS | Status: DC | PRN
  Administered 2017-10-17: 13:00:00 via INTRA_ARTERIAL

## 2017-10-17 MED ORDER — MIDAZOLAM HCL 2 MG/2ML IJ SOLN
INTRAMUSCULAR | Status: AC
  Filled 2017-10-17: qty 2

## 2017-10-17 MED ORDER — LIDOCAINE HCL (PF) 1 % IJ SOLN
INTRAMUSCULAR | Status: AC
  Filled 2017-10-17: qty 30

## 2017-10-17 MED ORDER — ONDANSETRON HCL 4 MG/2ML IJ SOLN
4.0000 mg | Freq: Once | INTRAMUSCULAR | Status: AC
  Administered 2017-10-17: 4 mg via INTRAVENOUS

## 2017-10-17 MED ORDER — ACETAMINOPHEN 325 MG PO TABS
650.0000 mg | ORAL_TABLET | Freq: Four times a day (QID) | ORAL | Status: DC | PRN
  Administered 2017-10-17: 650 mg via ORAL

## 2017-10-17 MED ORDER — AMLODIPINE BESYLATE 10 MG PO TABS: 10.0000 mg | ORAL_TABLET | Freq: Every day | ORAL | 4 refills | Status: DC

## 2017-10-17 SURGICAL SUPPLY — 16 items
CATH BALLN WEDGE 5F 110CM (CATHETERS) ×1 IMPLANT
CATH INFINITI 5 FR JL3.5 (CATHETERS) ×1 IMPLANT
CATH INFINITI 5FR ANG PIGTAIL (CATHETERS) ×1 IMPLANT
CATH OPTITORQUE TIG 4.0 5F (CATHETERS) ×1 IMPLANT
COVER PRB 48X5XTLSCP FOLD TPE (BAG) IMPLANT
COVER PROBE 5X48 (BAG) ×2
DEVICE RAD COMP TR BAND LRG (VASCULAR PRODUCTS) ×1 IMPLANT
GLIDESHEATH SLEND SS 6F .021 (SHEATH) ×1 IMPLANT
GUIDEWIRE INQWIRE 1.5J.035X260 (WIRE) IMPLANT
INQWIRE 1.5J .035X260CM (WIRE) ×2
KIT HEART LEFT (KITS) ×2 IMPLANT
KIT PREMIUM HAND CONTROLLER (KITS) ×1 IMPLANT
KIT SINGLE USE MANIFOLD (KITS) ×1 IMPLANT
PACK CARDIAC CATHETERIZATION (CUSTOM PROCEDURE TRAY) ×2 IMPLANT
SHEATH GLIDE SLENDER 4/5FR (SHEATH) ×1 IMPLANT
TRANSDUCER W/STOPCOCK (MISCELLANEOUS) ×2 IMPLANT

## 2017-10-17 NOTE — Progress Notes (Signed)
Site area: right groin  Site Prior to Removal:  Level 0  Pressure Applied For 15 MINUTES    Minutes Beginning at 15  Manual:   Yes.    Patient Status During Pull:  stable  Post Pull brachial Site:  Level 0  Post Pull Instructions Given:  Yes.    Post Pull Pulses Present:  Yes.    Dressing Applied:  Yes.    Comments:  tegederm and coban dsg applied

## 2017-10-17 NOTE — Interval H&P Note (Signed)
History and Physical Interval Note:  10/17/2017 12:33 PM   Was notified by patient email that she has been having progressively more frequent episodes of dyspnea and chest discomfort.  At this point the option would be yet again another stress test versus proceeding with cardiac catheterization.  Her PCP had recommended cardiac catheterization and that seems reasonable for me.  Would plan right and left heart catheterization.  She is currently scheduled to see me on February 14, however if she is willing, does not necessarily need to come in on that schedule her for cardiac catheterization.  However she wants to come in to discuss this , we can have her continue to follow-up as scheduled.  Principal Problem:   Progressive angina (HCC) Active Problems:   Chest pain with high risk for cardiac etiology - Persistent Sx despite Negative Ischemic Evaluation. c/w Class III-IV progressive Anginal Equivalent   Essential hypertension   DOE (dyspnea on exertion)     Tammie Hew, MD   Chizuko A Glaeser  has presented today for surgery, with the diagnosis of Atypical angina (continued symptoms despite negative evaluation to date) the various methods of treatment have been discussed with the patient and family. After consideration of risks, benefits and other options for treatment, the patient has consented to  Procedure(s): RIGHT/LEFT HEART CATH AND CORONARY ANGIOGRAPHY (N/A) with POSSIBLE PERCUTANEOUS CORONARY INTERVENTION as a surgical intervention .  The patient's history has been reviewed, patient examined, no change in status, stable for surgery.  I have reviewed the patient's chart and labs.  Questions were answered to the patient's satisfaction.   Cath Lab Visit (complete for each Cath Lab visit)  Clinical Evaluation Leading to the Procedure:   ACS: No.  Non-ACS:    Anginal Classification: CCS III  Anti-ischemic medical therapy: Maximal Therapy (2 or more classes of  medications)  Non-Invasive Test Results: Equivocal test results -normal coronary CT CT scan and echocardiogram, but persistent symptoms.  Therefore deferred further stress testing for invasive evaluation for progressively worsening symptoms  Prior CABG: No previous CABG   Tammie Powell

## 2017-10-17 NOTE — Progress Notes (Signed)
[facility]/ cardiology was called x 2, pt c/o headache, second time pt c/o nausea, orders followed for each.

## 2017-10-17 NOTE — Research (Signed)
CADFEM Research Study Informed Consent   Subject Name: Tammie Powell  Subject met inclusion and exclusion criteria.  The informed consent form, study requirements and expectations were reviewed with the subject and questions and concerns were addressed prior to the signing of the consent form.  The subject verbalized understanding of the trial requirements.  The subject agreed to participate in the  trial and signed the informed consent at 1220 on 10/17/17.  The informed consent was obtained prior to performance of any protocol-specific procedures for the subject.  A copy of the signed informed consent was given to the subject and a copy was placed in the subject's medical record.  Blossom Hoops 10/17/2017, 12:30 PM

## 2017-10-17 NOTE — Discharge Instructions (Signed)

## 2017-10-18 ENCOUNTER — Emergency Department (HOSPITAL_COMMUNITY)
Admission: EM | Admit: 2017-10-18 | Discharge: 2017-10-18 | Discharge status: Against Medical Advice | Payer: BLUE CROSS/BLUE SHIELD | Attending: Emergency Medicine | Admitting: Emergency Medicine

## 2017-10-18 ENCOUNTER — Encounter (HOSPITAL_COMMUNITY): Payer: Self-pay | Admitting: Emergency Medicine

## 2017-10-18 ENCOUNTER — Other Ambulatory Visit: Payer: Self-pay

## 2017-10-18 DIAGNOSIS — R51 Headache: Secondary | ICD-10-CM | POA: Diagnosis not present

## 2017-10-18 DIAGNOSIS — R1110 Vomiting, unspecified: Secondary | ICD-10-CM | POA: Insufficient documentation

## 2017-10-18 DIAGNOSIS — Z5321 Procedure and treatment not carried out due to patient leaving prior to being seen by health care provider: Secondary | ICD-10-CM | POA: Diagnosis not present

## 2017-10-18 MED FILL — Heparin Sodium (Porcine) 2 Unit/ML in Sodium Chloride 0.9%: INTRAMUSCULAR | Qty: 1000 | Status: AC

## 2017-10-18 NOTE — ED Notes (Signed)
Called to triage x 2 with no answer

## 2017-10-18 NOTE — ED Triage Notes (Signed)
Pt c/o vomiting and headache since having heart cath yesterday.

## 2017-10-19 NOTE — ED Notes (Signed)
10/19/2017, follow-up call completed

## 2017-10-22 ENCOUNTER — Encounter: Payer: Self-pay | Admitting: Cardiology

## 2017-11-02 ENCOUNTER — Encounter: Payer: Self-pay | Admitting: Cardiology

## 2017-11-02 ENCOUNTER — Ambulatory Visit: Payer: BLUE CROSS/BLUE SHIELD | Admitting: Cardiology

## 2017-11-02 ENCOUNTER — Telehealth: Payer: Self-pay | Admitting: Cardiology

## 2017-11-02 VITALS — BP 122/72 | HR 91 | Ht 65.0 in | Wt 237.2 lb

## 2017-11-02 DIAGNOSIS — I10 Essential (primary) hypertension: Secondary | ICD-10-CM

## 2017-11-02 DIAGNOSIS — E669 Obesity, unspecified: Secondary | ICD-10-CM | POA: Diagnosis not present

## 2017-11-02 DIAGNOSIS — R079 Chest pain, unspecified: Secondary | ICD-10-CM | POA: Diagnosis not present

## 2017-11-02 DIAGNOSIS — R000 Tachycardia, unspecified: Secondary | ICD-10-CM | POA: Diagnosis not present

## 2017-11-02 DIAGNOSIS — I201 Angina pectoris with documented spasm: Secondary | ICD-10-CM | POA: Diagnosis not present

## 2017-11-02 DIAGNOSIS — R0609 Other forms of dyspnea: Secondary | ICD-10-CM | POA: Diagnosis not present

## 2017-11-02 DIAGNOSIS — Z8249 Family history of ischemic heart disease and other diseases of the circulatory system: Secondary | ICD-10-CM | POA: Diagnosis not present

## 2017-11-02 DIAGNOSIS — E66811 Obesity, class 1: Secondary | ICD-10-CM

## 2017-11-02 MED ORDER — ISOSORBIDE MONONITRATE ER 30 MG PO TB24: 30.0000 mg | ORAL_TABLET | Freq: Every day | ORAL | 3 refills | Status: DC

## 2017-11-02 NOTE — Progress Notes (Signed)
PCP: Sharilyn Sites, MD  Clinic Note: Chief Complaint  Patient presents with  . Post-op Follow-up    No major complaints    HPI: Tammie Powell is a 47 y.o. female with a PMH below who presents today for post-cath f/u. Tammie Powell was initially seen back in October 2018 for evaluation of chest pain after several emergency room visits.  She un monitor was essentially normal with some [place] cardiac calcium score test evaluation as well as cardiac event monitor.Coronary calcium score was very low risk.  Occasional PVCs.  She then was evaluated at [facility] emergency room with a GXT that was negative for ischemia.  Despite this, she continued to have chest discomfort to be evaluated with echocardiogramto assess cardiac function both systolic and diastolic.  Unfortunately, she then continued to have issues of exertional dyspnea and chest pain so she ended up being referred for cardiac catheterization.  Tammie Powell was last seen on September 19, 2017 --> I actually ordered an echo, and in the interim, we decided to go to cardiac cath.  Recent Hospitalizations: None since cardiac cath.  Studies Personally Reviewed - (if available, images/films reviewed: From Epic Chart or Care Everywhere)  Transthoracic echo October 11, 2017: EF 60-65%.  No regional wall motion normalities.  No evidence of elevated PA pressures.  Normal valves.  Essentially normal echo  R&L Heart Cath 10/17/2017: Normal LVEDP.  Possibly 2+ MR with normal systolic function.  Normal right heart pressures. -->  Increased amlodipine to 10 mg daily  Interval History: Tammie Powell now returns today for reevaluation.  She is still not gone back to work yet, stating that she just did not "feel well "after her heart cath..  Unfortunately in the interim of the last few months she is gained almost 40 pounds because she has not been exercising and watching her diet. She told me that she has had 2 episodes of chest pain since her heart  catheterization  that did respond to nitroglycerin.  She says the episodes are not as bad as a bit before not lasting as long.  The episodes occurred at rest and not with exertion, but she is yet to really do any exertional activities to know if there is any exertional exacerbation. No PND, orthopnea or edema.  No syncope or near syncope, but has had occasional episodes of flip flopping/skipping heartbeats.  Nothing prolonged.  No lightheadedness or dizziness.  No TIA or amaurosis fugax symptoms.  No melena, hematochezia, hematuria or epistaxis.  ROS: A comprehensive was performed. Review of Systems  Constitutional: Negative for malaise/fatigue (Is now out of shape and deconditioned.  Less energy.) and weight loss (Notable weight gain).  HENT: Negative for congestion and nosebleeds.   Respiratory: Positive for shortness of breath (Exertional). Negative for sputum production and wheezing.   Cardiovascular: Negative for orthopnea.       Per HPI  Gastrointestinal: Negative for abdominal pain, blood in stool and melena.  Musculoskeletal: Negative for back pain, falls and joint pain.  Neurological: Positive for dizziness and headaches.  Psychiatric/Behavioral: The patient is nervous/anxious.   All other systems reviewed and are negative.  I have reviewed and (if needed) personally updated the patient's problem list, medications, allergies, past medical and surgical history, social and family history.   Past Medical History:  Diagnosis Date  . Hypertension   . Thyroid disease     Past Surgical History:  Procedure Laterality Date  . CHOLECYSTECTOMY    . RIGHT/LEFT HEART CATH AND  CORONARY ANGIOGRAPHY N/A 10/17/2017   Procedure: RIGHT/LEFT HEART CATH AND CORONARY ANGIOGRAPHY;  Surgeon: Leonie Man, MD;  Location: [place] [facility];  Service: Cardiovascular;:: Normal LVEDP.  Possibly 2+ MR with normal systolic function.  Normal right heart pressures. -->  Increased amlodipine to 10 mg daily    . TRANSTHORACIC ECHOCARDIOGRAM  09/2017   EF 60-65%.  No regional wall motion normalities.  No evidence of elevated PA pressures.  Normal valves.  Essentially normal echo  . TUBAL LIGATION      Current Meds  Medication Sig  . amLODipine (NORVASC) 10 MG tablet Take 1 tablet (10 mg total) by mouth daily.  Marland Kitchen aspirin EC 81 MG tablet Take 81 mg by mouth daily.  . Cholecalciferol (VITAMIN D PO) Take 1 capsule by mouth daily.  Marland Kitchen ibuprofen (ADVIL,MOTRIN) 200 MG tablet Take 400 mg by mouth every 6 (six) hours as needed for headache or mild pain.  Marland Kitchen levothyroxine (SYNTHROID) 137 MCG tablet Take 137 mcg by mouth daily before breakfast.  . metoprolol tartrate (LOPRESSOR) 50 MG tablet Take 50 mg by mouth 2 (two) times daily.  . nitroGLYCERIN (NITROSTAT) 0.4 MG SL tablet Place 1 tablet (0.4 mg total) every 5 (five) minutes as needed under the tongue for chest pain.  . valACYclovir (VALTREX) 500 MG tablet Take 2,000 mg by mouth daily as needed (fever blisters).     Allergies  Allergen Reactions  . Codeine Hives, Itching and Other (See Comments)    burning  . Gadolinium Derivatives Hives    Patient had a few hives that were mildly itchy. Checked by dr Carlis Abbott, since severity was so low, and patient had no driver, he recommended she take benadryl at home. She remained in office for 40 minutes post injection to be observed.     Social History   Tobacco Use  . Smoking status: Never Smoker  . Smokeless tobacco: Never Used  Substance Use Topics  . Alcohol use: No  . Drug use: No   Social History   Social History Narrative   She currently works at a [employer], building tires   She never smoked. Does not do routine exercise, but has recently started adjusting her diet.    family history includes Aneurysm in her maternal aunt; Arthritis in her maternal grandmother; CAD in her father; Heart attack (age of onset: 22) in her father; Hypertension in her maternal grandmother and mother; Hypothyroidism  in her mother, sister, and sister; Seizures in her maternal aunt; Stroke in her mother.  Wt Readings from Last 3 Encounters:  11/02/17 237 lb 3.2 oz (107.6 kg)  Jan 23  213 lb. ? Accuracy of today's wgt = @ home gained 14 lb  PHYSICAL EXAM BP 122/72 (BP Location: Left Arm, Patient Position: Sitting, Cuff Size: Normal)   Pulse 91   Ht 5\' 5"  (1.651 m)   Wt 237 lb 3.2 oz (107.6 kg)   SpO2 96%   BMI 39.47 kg/m  Physical Exam  Constitutional: She is oriented to person, place, and time. She appears well-developed and well-nourished. No distress.  Well-groomed.  Now borderline morbidly obese  HENT:  Head: Normocephalic and atraumatic.  Neck: No hepatojugular reflux and no JVD present. Carotid bruit is not present.  Cardiovascular: Normal rate, regular rhythm and intact distal pulses. Exam reveals no gallop and no friction rub.  No murmur heard. Pulmonary/Chest: Effort normal and breath sounds normal. No respiratory distress. She has no wheezes. She has no rales.  Abdominal: Soft.  Bowel sounds are normal. She exhibits no distension. There is no tenderness. There is no rebound.  Musculoskeletal: Normal range of motion. She exhibits no edema or tenderness.  Neurological: She is alert and oriented to person, place, and time.  Skin: Skin is warm and dry.  Psychiatric: She has a normal mood and affect. Her behavior is normal. Judgment and thought content normal.  Nursing note and vitals reviewed.    Adult ECG Report Not checked  Other studies Reviewed: Additional studies/ records that were reviewed today include:  Recent Labs:   Lab Results  Component Value Date   CREATININE 0.74 10/12/2017   BUN 11 10/12/2017   NA 141 10/12/2017   K 4.2 10/12/2017   CL 103 10/12/2017   CO2 22 10/12/2017   No results found for: CHOL, HDL, LDLCALC, LDLDIRECT, TRIG, CHOLHDL  Now borderline morbidly obese.   ASSESSMENT / PLAN: Problem List Items Addressed This Visit    Racing heart beat (Chronic)      Well-controlled on beta-blocker.      Obesity (BMI 30.0-34.9) - Primary (Chronic)   Family history of premature coronary artery disease (Chronic)    Reassuring results now with minimal disease noted on coronary calcium score, negative GXT and now minimal disease on cath.  We are considering the possibility of coronary spasm related symptoms, but I did not see evidence in the Cath Lab.  Continue risk factor modification.      Essential hypertension (Chronic)    Stable on current meds.      Relevant Medications   isosorbide mononitrate (IMDUR) 30 MG 24 hr tablet   DOE (dyspnea on exertion)    Hard to say what her dyspnea on exertion is related to, probably more related to deconditioning.  So far her cardiac evaluation has been essentially normal.  May be some mildly elevated PA pressures and right-sided pressures, but nothing to explain the level of dyspnea. Needs to lose weight      Coronary artery spasm (HCC) (Chronic)    No clear documentation of coronary spasm, but it would seem that that quite likely her symptoms are related to.  Now on max dose amlodipine, and adding Imdur.      Relevant Medications   isosorbide mononitrate (IMDUR) 30 MG 24 hr tablet   Chest pain with moderate risk for cardiac etiology - Persistent Sx despite Negative Ischemic Evaluation -- > and normal cath (Chronic)    By cardiac catheterization confirmed to have minimal CAD.  I do not think that her exertional symptoms are related to angina.  She may very well have some coronary spasm that goes along with her having resting pain.  Symptoms seem to have improved since we increased amlodipine to 10, but she still has some discomfort. We will have her take amlodipine in the morning and then take Imdur 30 mrem nightly.  Also need to combine on cardiac issues.         Current medicines are reviewed at length with the patient today. (+/- concerns) n/a The following changes have been made: see  below  Patient Instructions  Medication Instructions:  START isosorbide (Imdur) 30 mg at bedtime --take tylenol before  Follow-Up: Your physician wants you to follow-up in: 6 months with Dr. Ellyn Hack.  You will receive a reminder letter in the mail two months in advance. If you don't receive a letter, please call our office to schedule the follow-up appointment.    If you need a refill on your cardiac medications  before your next appointment, please call your pharmacy.     Studies Ordered:   No orders of the defined types were placed in this encounter.     Glenetta Hew, M.D., M.S. Interventional Cardiologist   Pager # [phone] Phone # [phone] [address] [place], [place] [address]   Thank you for choosing Heartcare at [facility]!!

## 2017-11-02 NOTE — Telephone Encounter (Signed)
New message   Patient requesting a return to work note for 11/05/2017  Fax # [phone] Attn: [place]

## 2017-11-02 NOTE — Telephone Encounter (Signed)
Per Dr Ellyn Hack ok for patient to return to work without restrictions

## 2017-11-02 NOTE — Telephone Encounter (Signed)
Unable to reach patient via phone. Called twice and a female answered at number listed. He did not respond to questions I asked.

## 2017-11-02 NOTE — Patient Instructions (Signed)
Medication Instructions:  START isosorbide (Imdur) 30 mg at bedtime --take tylenol before  Follow-Up: Your physician wants you to follow-up in: 6 months with Dr. Ellyn Hack.  You will receive a reminder letter in the mail two months in advance. If you don't receive a letter, please call our office to schedule the follow-up appointment.    If you need a refill on your cardiac medications before your next appointment, please call your pharmacy.

## 2017-11-04 ENCOUNTER — Encounter: Payer: Self-pay | Admitting: Cardiology

## 2017-11-04 NOTE — Assessment & Plan Note (Signed)
No clear documentation of coronary spasm, but it would seem that that quite likely her symptoms are related to.  Now on max dose amlodipine, and adding Imdur.

## 2017-11-04 NOTE — Assessment & Plan Note (Addendum)
Reassuring results now with minimal disease noted on coronary calcium score, negative GXT and now minimal disease on cath.  We are considering the possibility of coronary spasm related symptoms, but I did not see evidence in the Cath Lab.  Continue risk factor modification.

## 2017-11-04 NOTE — Assessment & Plan Note (Signed)
By cardiac catheterization confirmed to have minimal CAD.  I do not think that her exertional symptoms are related to angina.  She may very well have some coronary spasm that goes along with her having resting pain.  Symptoms seem to have improved since we increased amlodipine to 10, but she still has some discomfort. We will have her take amlodipine in the morning and then take Imdur 30 mrem nightly.  Also need to combine on cardiac issues.

## 2017-11-04 NOTE — Assessment & Plan Note (Signed)
Hard to say what her dyspnea on exertion is related to, probably more related to deconditioning.  So far her cardiac evaluation has been essentially normal.  May be some mildly elevated PA pressures and right-sided pressures, but nothing to explain the level of dyspnea. Needs to lose weight

## 2017-11-04 NOTE — Assessment & Plan Note (Signed)
Well-controlled on beta-blocker. 

## 2017-11-04 NOTE — Assessment & Plan Note (Signed)
Stable on current meds 

## 2017-11-05 ENCOUNTER — Encounter: Payer: Self-pay | Admitting: Cardiology

## 2017-11-06 NOTE — Telephone Encounter (Signed)
Sent patient a response via MyChart message that she sent on 11/05/17

## 2017-11-07 NOTE — Telephone Encounter (Signed)
FAXED RETURN TO WORK ,PER PATIENT REQUEST

## 2017-11-07 NOTE — Telephone Encounter (Signed)
Patient called in concerning return to work letter. She states her company doctor needs a FORMAL LETTER stating she is released to return to work on November 05, 2017 with [name]  Fax: [phone]

## 2017-11-08 ENCOUNTER — Encounter: Payer: Self-pay | Admitting: *Deleted

## 2017-11-16 ENCOUNTER — Other Ambulatory Visit: Payer: Self-pay

## 2017-11-16 ENCOUNTER — Emergency Department (HOSPITAL_COMMUNITY)
Admission: EM | Admit: 2017-11-16 | Discharge: 2017-11-16 | Disposition: A | Discharge status: Home / Self Care | Payer: BLUE CROSS/BLUE SHIELD | Attending: Emergency Medicine | Admitting: Emergency Medicine

## 2017-11-16 ENCOUNTER — Emergency Department (HOSPITAL_COMMUNITY): Payer: BLUE CROSS/BLUE SHIELD

## 2017-11-16 ENCOUNTER — Encounter (HOSPITAL_COMMUNITY): Payer: Self-pay | Admitting: Emergency Medicine

## 2017-11-16 DIAGNOSIS — Z79899 Other long term (current) drug therapy: Secondary | ICD-10-CM | POA: Diagnosis not present

## 2017-11-16 DIAGNOSIS — R0789 Other chest pain: Secondary | ICD-10-CM | POA: Diagnosis not present

## 2017-11-16 DIAGNOSIS — I10 Essential (primary) hypertension: Secondary | ICD-10-CM | POA: Diagnosis not present

## 2017-11-16 DIAGNOSIS — Z7982 Long term (current) use of aspirin: Secondary | ICD-10-CM | POA: Insufficient documentation

## 2017-11-16 DIAGNOSIS — R079 Chest pain, unspecified: Secondary | ICD-10-CM | POA: Diagnosis present

## 2017-11-16 LAB — BASIC METABOLIC PANEL
Anion gap: 8 (ref 5–15)
BUN: 12 mg/dL (ref 6–20)
CHLORIDE: 107 mmol/L (ref 101–111)
CO2: 28 mmol/L (ref 22–32)
CREATININE: 0.96 mg/dL (ref 0.44–1.00)
Calcium: 9.1 mg/dL (ref 8.9–10.3)
GFR calc Af Amer: >60 mL/min (ref >60)
GFR calc non Af Amer: >60 mL/min (ref >60)
GLUCOSE: 101 mg/dL — AB (ref 65–99)
Potassium: 4.3 mmol/L (ref 3.5–5.1)
Sodium: 143 mmol/L (ref 135–145)

## 2017-11-16 LAB — TROPONIN I
Troponin I: <0.03 ng/mL (ref <0.03)
Troponin I: <0.03 ng/mL (ref <0.03)

## 2017-11-16 LAB — CBC
HCT: 40 % (ref 36.0–46.0)
Hemoglobin: 13 g/dL (ref 12.0–15.0)
MCH: 30.3 pg (ref 26.0–34.0)
MCHC: 32.5 g/dL (ref 30.0–36.0)
MCV: 93.2 fL (ref 78.0–100.0)
PLATELETS: 214 10*3/uL (ref 150–400)
RBC: 4.29 MIL/uL (ref 3.87–5.11)
RDW: 12.3 % (ref 11.5–15.5)
WBC: 6.3 10*3/uL (ref 4.0–10.5)

## 2017-11-16 LAB — D-DIMER, QUANTITATIVE: D-Dimer, Quant: 0.42 ug/mL-FEU (ref 0.00–0.50)

## 2017-11-16 MED ORDER — ISOSORBIDE MONONITRATE ER 120 MG PO TB24: 120.0000 mg | ORAL_TABLET | Freq: Every day | ORAL | 0 refills | Status: DC

## 2017-11-16 NOTE — ED Triage Notes (Signed)
Patient c/o left side chest pain that radiates into left arm. Per patient pain started this morning. Per patient hx of coronary spasms and cardiac cath on 10/17/17. Patient reports taking x2 SL nitro this morning with some relief.

## 2017-11-16 NOTE — ED Provider Notes (Signed)
[facility] EMERGENCY DEPARTMENT Provider Note   CSN: [id number] Arrival date & time: 11/16/17  1150     History   Chief Complaint Chief Complaint  Patient presents with  . Chest Pain    HPI Tammie Powell is a 47 y.o. female.   Chest Pain     Pt was seen at 1310. Per pt, c/o gradual onset and resolution of one episode of chest "pains" while getting out of the shower today approximately 0830am. Pt describes the CP as a "twisting" pain, and "what they told me was coronary spasms." Has been associated with SOB. Pt took her own SL ntg x2 with complete improvement of her symptoms. Denies palpitations, no cough, no abd pain, no N/V/D, no back pain.    Past Medical History:  Diagnosis Date  . Hypertension   . Thyroid disease     Patient Active Problem List   Diagnosis Date Noted  . 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 Progressive angina ([place]) 10/17/2017  . DOE (dyspnea on exertion) 09/19/2017  . Family history of premature coronary artery disease 06/12/2017  . Chest pain with moderate risk for cardiac etiology - Persistent Sx despite Negative Ischemic Evaluation -- > and normal cath 06/12/2017  . Essential hypertension 06/12/2017  . Obesity (BMI 30.0-34.9) 06/12/2017  . Racing heart beat 06/12/2017    Past Surgical History:  Procedure Laterality Date  . CHOLECYSTECTOMY    . RIGHT/LEFT HEART CATH AND CORONARY ANGIOGRAPHY N/A 10/17/2017   Procedure: RIGHT/LEFT HEART CATH AND CORONARY ANGIOGRAPHY;  Surgeon: Leonie Man, MD;  Location: [facility];  Service: Cardiovascular;:: Normal LVEDP.  Possibly 2+ MR with normal systolic function.  Normal right heart pressures. -->  Increased amlodipine to 10 mg daily  . TRANSTHORACIC ECHOCARDIOGRAM  09/2017   EF 60-65%.  No regional wall motion normalities.  No evidence of elevated PA pressures.  Normal valves.  Essentially normal echo  . TUBAL LIGATION      OB History   None      Home Medications    Prior to  Admission medications   Medication Sig Start Date End Date Taking? Authorizing Provider  amLODipine (NORVASC) 10 MG tablet Take 1 tablet (10 mg total) by mouth daily. 10/18/17   Leonie Man, MD  aspirin EC 81 MG tablet Take 81 mg by mouth daily.    [provider]  Cholecalciferol (VITAMIN D PO) Take 1 capsule by mouth daily.    [provider]  ibuprofen (ADVIL,MOTRIN) 200 MG tablet Take 400 mg by mouth every 6 (six) hours as needed for headache or mild pain.    [provider]  isosorbide mononitrate (IMDUR) 30 MG 24 hr tablet Take 1 tablet (30 mg total) by mouth daily. 11/02/17 01/31/18  Leonie Man, MD  levothyroxine (SYNTHROID) 137 MCG tablet Take 137 mcg by mouth daily before breakfast.    [provider]  metoprolol tartrate (LOPRESSOR) 50 MG tablet Take 50 mg by mouth 2 (two) times daily.    [provider]  nitroGLYCERIN (NITROSTAT) 0.4 MG SL tablet Place 1 tablet (0.4 mg total) every 5 (five) minutes as needed under the tongue for chest pain. 07/06/17   Leonie Man, MD  valACYclovir (VALTREX) 500 MG tablet Take 2,000 mg by mouth daily as needed (fever blisters).     [provider]    Family History Family History  Problem Relation Age of Onset  . Stroke Mother   . Hypertension Mother   .  Hypothyroidism Mother   . Seizures Maternal Aunt   . Aneurysm Maternal Aunt   . Heart attack Father 64  . CAD Father        stents  . Hypothyroidism Sister   . Hypertension Maternal Grandmother   . Arthritis Maternal Grandmother   . Hypothyroidism Sister     Social History Social History   Tobacco Use  . Smoking status: Never Smoker  . Smokeless tobacco: Never Used  Substance Use Topics  . Alcohol use: No  . Drug use: No     Allergies   Codeine and Gadolinium derivatives   Review of Systems Review of Systems  Cardiovascular: Positive for chest pain.  ROS: Statement: All systems negative except as marked or noted  in the HPI; Constitutional: Negative for fever and chills. ; ; Eyes: Negative for eye pain, redness and discharge. ; ; ENMT: Negative for ear pain, hoarseness, nasal congestion, sinus pressure and sore throat. ; ; Cardiovascular: +CP, SOB. Negative for palpitations, diaphoresis, and peripheral edema. ; ; Respiratory: Negative for cough, wheezing and stridor. ; ; Gastrointestinal: Negative for nausea, vomiting, diarrhea, abdominal pain, blood in stool, hematemesis, jaundice and rectal bleeding. . ; ; Genitourinary: Negative for dysuria, flank pain and hematuria. ; ; Musculoskeletal: Negative for back pain and neck pain. Negative for swelling and trauma.; ; Skin: Negative for pruritus, rash, abrasions, blisters, bruising and skin lesion.; ; Neuro: Negative for headache, lightheadedness and neck stiffness. Negative for weakness, altered level of consciousness, altered mental status, extremity weakness, paresthesias, involuntary movement, seizure and syncope.        Physical Exam Updated Vital Signs BP (!) 157/92 (BP Location: Right Arm)   Pulse 68   Temp 98.2 F (36.8 C) (Oral)   Resp 20   Ht 5\' 5"  (1.651 m)   Wt 107.5 kg (237 lb)   LMP 11/09/2017   SpO2 100%   BMI 39.44 kg/m   Physical Exam 1315: Physical examination:  Nursing notes reviewed; Vital signs and O2 SAT reviewed;  Constitutional: Well developed, Well nourished, Well hydrated, In no acute distress; Head:  Normocephalic, atraumatic; Eyes: EOMI, PERRL, No scleral icterus; ENMT: Mouth and pharynx normal, Mucous membranes moist; Neck: Supple, Full range of motion, No lymphadenopathy; Cardiovascular: Regular rate and rhythm, No gallop; Respiratory: Breath sounds clear & equal bilaterally, No wheezes.  Speaking full sentences with ease, Normal respiratory effort/excursion; Chest: Nontender, Movement normal; Abdomen: Soft, Nontender, Nondistended, Normal bowel sounds; Genitourinary: No CVA tenderness; Extremities: Peripheral pulses normal, No  tenderness, No edema, No calf edema or asymmetry.; Neuro: AA&Ox3, Major CN grossly intact.  Speech clear. No gross focal motor or sensory deficits in extremities.; Skin: Color normal, Warm, Dry.   ED Treatments / Results  Labs (all labs ordered are listed, but only abnormal results are displayed)   EKG  EKG Interpretation  Date/Time:  Friday November 16 2017 11:59:26 EDT Ventricular Rate:  64 PR Interval:  136 QRS Duration: 68 QT Interval:  394 QTC Calculation: 406 R Axis:   24 Text Interpretation:  Normal sinus rhythm Low voltage QRS Cannot rule out Anterior infarct , age undetermined Abnormal ECG Since last tracing rate slower Confirmed by Orlie Dakin [phone]) on 11/16/2017 12:12:32 PM       Radiology   Procedures Procedures (including critical care time)  Medications Ordered in ED Medications - No data to display   Initial Impression / Assessment and Plan / ED Course  I have reviewed the triage vital signs and the nursing notes.  Pertinent labs & imaging results that were available during my care of the patient were reviewed by me and considered in my medical decision making (see chart for details).  MDM Reviewed: previous chart, nursing note and vitals Reviewed previous: labs and ECG Interpretation: labs, ECG and x-ray   Results for orders placed or performed during the hospital encounter of 68/34/19  Basic metabolic panel  Result Value Ref Range   Sodium 143 135 - 145 mmol/L   Potassium 4.3 3.5 - 5.1 mmol/L   Chloride 107 101 - 111 mmol/L   CO2 28 22 - 32 mmol/L   Glucose, Bld 101 (H) 65 - 99 mg/dL   BUN 12 6 - 20 mg/dL   Creatinine, Ser 0.96 0.44 - 1.00 mg/dL   Calcium 9.1 8.9 - 10.3 mg/dL   GFR calc non Af Amer >60 >60 mL/min   GFR calc Af Amer >60 >60 mL/min   Anion gap 8 5 - 15  CBC  Result Value Ref Range   WBC 6.3 4.0 - 10.5 K/uL   RBC 4.29 3.87 - 5.11 MIL/uL   Hemoglobin 13.0 12.0 - 15.0 g/dL   HCT 40.0 36.0 - 46.0 %   MCV 93.2 78.0 - 100.0  fL   MCH 30.3 26.0 - 34.0 pg   MCHC 32.5 30.0 - 36.0 g/dL   RDW 12.3 11.5 - 15.5 %   Platelets 214 150 - 400 K/uL  Troponin I  Result Value Ref Range   Troponin I <0.03 <0.03 ng/mL  D-dimer, quantitative  Result Value Ref Range   D-Dimer, Quant 0.42 0.00 - 0.50 ug/mL-FEU  Troponin I  Result Value Ref Range   Troponin I <0.03 <0.03 ng/mL   Dg Chest 2 View Result Date: 11/16/2017 CLINICAL DATA:  Left-sided chest pain for several hours EXAM: CHEST - 2 VIEW COMPARISON:  07/01/2017 FINDINGS: The heart size and mediastinal contours are within normal limits. Both lungs are clear. The visualized skeletal structures are unremarkable. IMPRESSION: No active cardiopulmonary disease. Electronically Signed   By: Inez Catalina M.D.   On: 11/16/2017 12:20    1410:  Workup reassuring. T/C returned from [facility] Dr. Harrington Challenger, case discussed, including:  HPI, pertinent PM/SHx, VS/PE, dx testing, ED course and treatment:  Agrees with ED workup, requests to obtain 2nd troponin, if negative can d/c pt to continue amlodipine with imdur 30mg  PO daily, keep hydrated and she will facilitate f/u appt with Dr. Ellyn Hack. Dx and testing, as well as d/w Cards MD, d/w pt.  Questions answered.  Verb understanding, agreeable with plan.   1845:  2nd troponin continues negative. Pt remains symptom free while in the ED. Pt states she is ready to go home now.  Pt's meds list now updated: already includes imdur 30mg  daily. T/C returned from [facility] Dr. Percival Spanish, case discussed, including:  HPI, pertinent PM/SHx, VS/PE, dx testing, ED course and treatment:  Ok to increase imdur to 120mg  PO daily, f/u Dr. Ellyn Hack. Dx and testing d/w pt.  Questions answered.  Verb understanding, agreeable to d/c home with outpt f/u.     Final Clinical Impressions(s) / ED Diagnoses   Final diagnoses:  None    ED Discharge Orders    None       Francine Graven, DO 11/20/17 1517

## 2017-11-16 NOTE — ED Notes (Signed)
Have given MD EKG

## 2017-11-16 NOTE — Discharge Instructions (Addendum)
Take the new prescription as directed. Keep yourself well hydrated.  Call your regular Cardiologist on Monday to schedule a follow up appointment within the next 3 days.  Return to the Emergency Department immediately sooner if worsening.

## 2017-12-05 NOTE — Progress Notes (Deleted)
Cardiology Office Note   Date:  12/05/2017   ID:  Tammie Powell, DOB 1971-05-22, MRN [id number]  PCP:  Sharilyn Sites, MD  Cardiologist: Dr. Ellyn Hack No chief complaint on file.    History of Present Illness: Tammie Powell is a 47 y.o. female who presents for ongoing assessment and management of coronary artery disease, the patient was last seen by Dr. Ellyn Hack on 11/02/2017 status post cardiac catheterization follow-up.  She had not gone back to work at that time as she did not feel well after having had a cardiac catheterization, she apparently had gained approximately 40 pounds but she had not been exercising and watching her diet.  She had 2 episodes of recurrent chest pain since her heart catheterization which did not respond to nitroglycerin.  Cardiac catheterization confirmed that she had minimal CAD, it was not felt that her discomfort in her chest was related to ischemia however she may have some coronary spasms which would coincide with resting pain.  She was advised to take amlodipine in the morning and indoor in the evening.  It was felt that her dyspnea on exertion was related to deconditioning and weight gain.  Unfortunately, the patient was seen in the emergency room on 11/16/2017 due to recurrent chest pain getting out of the shower described as a "twisting pain".  The patient took nitroglycerin sublingual with some resolution of chest discomfort prior to going to the ER.  She was ruled out for ACS during ER evaluation.  She was advised to follow-up with cardiology.    Past Medical History:  Diagnosis Date  . Hypertension   . Thyroid disease     Past Surgical History:  Procedure Laterality Date  . CHOLECYSTECTOMY    . RIGHT/LEFT HEART CATH AND CORONARY ANGIOGRAPHY N/A 10/17/2017   Procedure: RIGHT/LEFT HEART CATH AND CORONARY ANGIOGRAPHY;  Surgeon: Leonie Man, MD;  Location: [facility];  Service: Cardiovascular;:: Normal LVEDP.  Possibly 2+ MR with normal  systolic function.  Normal right heart pressures. -->  Increased amlodipine to 10 mg daily  . TRANSTHORACIC ECHOCARDIOGRAM  09/2017   EF 60-65%.  No regional wall motion normalities.  No evidence of elevated PA pressures.  Normal valves.  Essentially normal echo  . TUBAL LIGATION       Current Outpatient Medications  Medication Sig Dispense Refill  . amLODipine (NORVASC) 10 MG tablet Take 1 tablet (10 mg total) by mouth daily. 90 tablet 4  . aspirin EC 81 MG tablet Take 81 mg by mouth daily.    Marland Kitchen ibuprofen (ADVIL,MOTRIN) 200 MG tablet Take 400 mg by mouth every 6 (six) hours as needed for headache or mild pain.    . isosorbide mononitrate (IMDUR) 120 MG 24 hr tablet Take 1 tablet (120 mg total) by mouth daily. 15 tablet 0  . levothyroxine (SYNTHROID) 137 MCG tablet Take 137 mcg by mouth daily before breakfast.    . metoprolol tartrate (LOPRESSOR) 50 MG tablet Take 50 mg by mouth 2 (two) times daily.    . nitroGLYCERIN (NITROSTAT) 0.4 MG SL tablet Place 1 tablet (0.4 mg total) every 5 (five) minutes as needed under the tongue for chest pain. 25 tablet 6  . valACYclovir (VALTREX) 500 MG tablet Take 2,000 mg by mouth daily as needed (fever blisters).     . Vitamin D, Ergocalciferol, (DRISDOL) 50000 units CAPS capsule Take 50,000 Units by mouth every 7 (seven) days. Tuesday     No current facility-administered medications for this visit.  Allergies:   Codeine and Gadolinium derivatives    Social History:  The patient  reports that she has never smoked. She has never used smokeless tobacco. She reports that she does not drink alcohol or use drugs.   Family History:  The patient's family history includes Aneurysm in her maternal aunt; Arthritis in her maternal grandmother; CAD in her father; Heart attack (age of onset: 23) in her father; Hypertension in her maternal grandmother and mother; Hypothyroidism in her mother, sister, and sister; Seizures in her maternal aunt; Stroke in her mother.      ROS: All other systems are reviewed and negative. Unless otherwise mentioned in H&P    PHYSICAL EXAM: VS:  LMP 11/09/2017  , BMI There is no height or weight on file to calculate BMI. GEN: Well nourished, well developed, in no acute distress  HEENT: normal  Neck: no JVD, carotid bruits, or masses Cardiac: ***RRR; no murmurs, rubs, or gallops,no edema  Respiratory:  clear to auscultation bilaterally, normal work of breathing GI: soft, nontender, nondistended, + BS MS: no deformity or atrophy  Skin: warm and dry, no rash Neuro:  Strength and sensation are intact Psych: euthymic mood, full affect   EKG:  EKG {ACTION; IS/IS [id number]} ordered today. The ekg ordered today demonstrates ***   Recent Labs: 05/01/2017: TSH 2.915 11/16/2017: BUN 12; Creatinine, Ser 0.96; Hemoglobin 13.0; Platelets 214; Potassium 4.3; Sodium 143    Lipid Panel No results found for: CHOL, TRIG, HDL, CHOLHDL, VLDL, LDLCALC, LDLDIRECT    Wt Readings from Last 3 Encounters:  11/16/17 237 lb (107.5 kg)  11/02/17 237 lb 3.2 oz (107.6 kg)  10/18/17 189 lb (85.7 kg)      Other studies Reviewed: Additional studies/ records that were reviewed today include: ***. Review of the above records demonstrates: ***   ASSESSMENT AND PLAN:  1.  ***   Current medicines are reviewed at length with the patient today.    Labs/ tests ordered today include: *** Phill Myron. West Pugh, ANP, AACC   12/05/2017 4:58 PM    [facility] [address], [place], [place] [address] Phone: [phone]; Fax: [phone]

## 2017-12-06 ENCOUNTER — Ambulatory Visit: Payer: BLUE CROSS/BLUE SHIELD | Admitting: Adult Health

## 2017-12-31 ENCOUNTER — Ambulatory Visit: Payer: BLUE CROSS/BLUE SHIELD | Admitting: Adult Health

## 2018-01-01 ENCOUNTER — Encounter: Payer: Self-pay | Admitting: *Deleted

## 2018-04-29 NOTE — Progress Notes (Deleted)
Cardiology Office Note   Date:  04/29/2018   ID:  [name], DOB [birth year], MRN [id number]  PCP:  Tammie Sites, MD  Cardiologist: Dr. Ellyn Powell  No chief complaint on file.    History of Present Illness: Tammie Powell is a 47 y.o. female who presents for ongoing assessment and management of chest pain, hypertension, DOE related to obesity and deconditioning.   She has been evaluated in [place] and [facility] which was negative for ischemia in 09/2016. But due to ongoing chest pain she had a cardiac cath in 10/17/2017 which revealed angiographically normal coronary arteries. Could not exclude coronary spasm. She was seen last by Dr. Ellyn Powell on 11/24/2017 who added Imdur 30 mg daily.      Past Medical History:  Diagnosis Date  . Hypertension   . Thyroid disease     Past Surgical History:  Procedure Laterality Date  . CHOLECYSTECTOMY    . RIGHT/LEFT HEART CATH AND CORONARY ANGIOGRAPHY N/A 10/17/2017   Procedure: RIGHT/LEFT HEART CATH AND CORONARY ANGIOGRAPHY;  Surgeon: Tammie Man, MD;  Location: [facility];  Service: Cardiovascular;:: Normal LVEDP.  Possibly 2+ MR with normal systolic function.  Normal right heart pressures. -->  Increased amlodipine to 10 mg daily  . TRANSTHORACIC ECHOCARDIOGRAM  09/2017   EF 60-65%.  No regional wall motion normalities.  No evidence of elevated PA pressures.  Normal valves.  Essentially normal echo  . TUBAL LIGATION       Current Outpatient Medications  Medication Sig Dispense Refill  . amLODipine (NORVASC) 10 MG tablet Take 1 tablet (10 mg total) by mouth daily. 90 tablet 4  . aspirin EC 81 MG tablet Take 81 mg by mouth daily.    Marland Kitchen ibuprofen (ADVIL,MOTRIN) 200 MG tablet Take 400 mg by mouth every 6 (six) hours as needed for headache or mild pain.    . isosorbide mononitrate (IMDUR) 120 MG 24 hr tablet Take 1 tablet (120 mg total) by mouth daily. 15 tablet 0  . levothyroxine (SYNTHROID) 137 MCG tablet Take 137 mcg by mouth daily before  breakfast.    . metoprolol tartrate (LOPRESSOR) 50 MG tablet Take 50 mg by mouth 2 (two) times daily.    . nitroGLYCERIN (NITROSTAT) 0.4 MG SL tablet Place 1 tablet (0.4 mg total) every 5 (five) minutes as needed under the tongue for chest pain. 25 tablet 6  . valACYclovir (VALTREX) 500 MG tablet Take 2,000 mg by mouth daily as needed (fever blisters).     . Vitamin D, Ergocalciferol, (DRISDOL) 50000 units CAPS capsule Take 50,000 Units by mouth every 7 (seven) days. Tuesday     No current facility-administered medications for this visit.     Allergies:   Codeine and Gadolinium derivatives    Social History:  The patient  reports that she has never smoked. She has never used smokeless tobacco. She reports that she does not drink alcohol or use drugs.   Family History:  The patient's family history includes Aneurysm in her maternal aunt; Arthritis in her maternal grandmother; CAD in her father; Heart attack (age of onset: 11) in her father; Hypertension in her maternal grandmother and mother; Hypothyroidism in her mother, sister, and sister; Seizures in her maternal aunt; Stroke in her mother.    ROS: All other systems are reviewed and negative. Unless otherwise mentioned in H&P    PHYSICAL EXAM: VS:  There were no vitals taken for this visit. , BMI There is no height or weight on  file to calculate BMI. GEN: Well nourished, well developed, in no acute distress  HEENT: normal  Neck: no JVD, carotid bruits, or masses Cardiac: ***RRR; no murmurs, rubs, or gallops,no edema  Respiratory:  clear to auscultation bilaterally, normal work of breathing GI: soft, nontender, nondistended, + BS MS: no deformity or atrophy  Skin: warm and dry, no rash Neuro:  Strength and sensation are intact Psych: euthymic mood, full affect   EKG:  EKG {ACTION; IS/IS [id number]} ordered today. The ekg ordered today demonstrates ***   Recent Labs: 05/01/2017: TSH 2.915 11/16/2017: BUN 12; Creatinine, Ser  0.96; Hemoglobin 13.0; Platelets 214; Potassium 4.3; Sodium 143    Lipid Panel No results found for: CHOL, TRIG, HDL, CHOLHDL, VLDL, LDLCALC, LDLDIRECT    Wt Readings from Last 3 Encounters:  11/16/17 237 lb (107.5 kg)  11/02/17 237 lb 3.2 oz (107.6 kg)  10/18/17 189 lb (85.7 kg)      Other studies Reviewed: Cardiac Cath 10/17/2017 Conclusion     The left ventricular systolic function is normal.  LV end diastolic pressure is normal.  There is mild (2+) mitral regurgitation.   Angiographically normal coronary arteries.  Nitroglycerin responsive. Cannot exclude coronary spasm. Normal right heart cath pressures.  Plan: Discharge home after bed rest from short stay Increase amlodipine to 10 mg daily.     ASSESSMENT AND PLAN:  1.  ***   Current medicines are reviewed at length with the patient today.    Labs/ tests ordered today include: *** Tammie Powell. Tammie Powell, ANP, AACC   04/29/2018 2:47 PM    [facility] [address], [place], [place] [address] Phone: [phone]; Fax: [phone]

## 2018-05-01 ENCOUNTER — Ambulatory Visit: Payer: BLUE CROSS/BLUE SHIELD | Admitting: Adult Health

## 2018-05-22 ENCOUNTER — Encounter (HOSPITAL_COMMUNITY): Payer: Self-pay | Admitting: *Deleted

## 2018-05-22 ENCOUNTER — Emergency Department (HOSPITAL_COMMUNITY): Payer: BLUE CROSS/BLUE SHIELD

## 2018-05-22 ENCOUNTER — Emergency Department (HOSPITAL_COMMUNITY)
Admission: EM | Admit: 2018-05-22 | Discharge: 2018-05-22 | Disposition: A | Discharge status: Home / Self Care | Payer: BLUE CROSS/BLUE SHIELD | Attending: Emergency Medicine | Admitting: Emergency Medicine

## 2018-05-22 ENCOUNTER — Other Ambulatory Visit: Payer: Self-pay

## 2018-05-22 DIAGNOSIS — Z7982 Long term (current) use of aspirin: Secondary | ICD-10-CM | POA: Diagnosis not present

## 2018-05-22 DIAGNOSIS — Z79899 Other long term (current) drug therapy: Secondary | ICD-10-CM | POA: Diagnosis not present

## 2018-05-22 DIAGNOSIS — R079 Chest pain, unspecified: Secondary | ICD-10-CM | POA: Diagnosis not present

## 2018-05-22 DIAGNOSIS — I10 Essential (primary) hypertension: Secondary | ICD-10-CM | POA: Insufficient documentation

## 2018-05-22 LAB — BASIC METABOLIC PANEL
Anion gap: 7 (ref 5–15)
BUN: 13 mg/dL (ref 6–20)
CHLORIDE: 106 mmol/L (ref 98–111)
CO2: 25 mmol/L (ref 22–32)
CREATININE: 0.86 mg/dL (ref 0.44–1.00)
Calcium: 9 mg/dL (ref 8.9–10.3)
GFR calc Af Amer: >60 mL/min (ref >60)
GFR calc non Af Amer: >60 mL/min (ref >60)
GLUCOSE: 102 mg/dL — AB (ref 70–99)
POTASSIUM: 3.8 mmol/L (ref 3.5–5.1)
Sodium: 138 mmol/L (ref 135–145)

## 2018-05-22 LAB — TROPONIN I
Troponin I: <0.03 ng/mL (ref <0.03)
Troponin I: <0.03 ng/mL (ref <0.03)

## 2018-05-22 LAB — CBC
HEMATOCRIT: 42.3 % (ref 36.0–46.0)
Hemoglobin: 14.1 g/dL (ref 12.0–15.0)
MCH: 31.3 pg (ref 26.0–34.0)
MCHC: 33.3 g/dL (ref 30.0–36.0)
MCV: 94 fL (ref 78.0–100.0)
PLATELETS: 235 10*3/uL (ref 150–400)
RBC: 4.5 MIL/uL (ref 3.87–5.11)
RDW: 12.5 % (ref 11.5–15.5)
WBC: 8.7 10*3/uL (ref 4.0–10.5)

## 2018-05-22 LAB — HCG, SERUM, QUALITATIVE: PREG SERUM: NEGATIVE

## 2018-05-22 MED ORDER — ISOSORBIDE MONONITRATE ER 30 MG PO TB24: 30.0000 mg | ORAL_TABLET | Freq: Every day | ORAL | 0 refills | Status: DC

## 2018-05-22 NOTE — Discharge Instructions (Signed)
Take the prescription as directed.  Call your regular medical doctor tomorrow to schedule a follow up appointment within the next 2 days. Call your Cardiologist tomorrow morning to schedule a follow up appointment within the next week.  Return to the Emergency Department immediately sooner if worsening.

## 2018-05-22 NOTE — ED Notes (Signed)
EKG given to Thurnell Garbe, MD.

## 2018-05-22 NOTE — ED Triage Notes (Signed)
Pt with cp today and pt took one SL NTG at home with relief, denies pain at present.  + sob.  Pt also c/o swelling feet and ankles.  Pt states she has an appt with Dr. Hilma Favors in the morning.

## 2018-05-22 NOTE — ED Notes (Signed)
Pt with hx of coronary spasms per pt.

## 2018-05-22 NOTE — ED Provider Notes (Signed)
[facility] EMERGENCY DEPARTMENT Provider Note   CSN: [id number] Arrival date & time: 05/22/18  1912     History   Chief Complaint Chief Complaint  Patient presents with  . Chest Pain    x 1 SL NTG at home  . Shortness of Breath    HPI Tammie Powell is a 47 y.o. female.  HPI  Pt was seen at 2105. Per pt, c/o gradual onset and resolution of 2 separate episodes of acute flair of her chronic/recurrent chest "pains" today at noon and again at 1800. Has been associated with SOB. Pt took 1 SL ntg each time with complete resolution of her symptoms. Pt endorses hx of same with extensive reassuring cardiac workup. Pt states working dx is "coronary spasms." Denies palpitations, no cough, no abd pain, no N/V/D, no back pain, no calf/LE pain or unilateral swelling. The symptoms have been associated with no other complaints. The patient has a significant history of similar symptoms previously, recently being evaluated for this complaint and multiple prior evals for same.      Past Medical History:  Diagnosis Date  . Hypertension   . Thyroid disease     Patient Active Problem List   Diagnosis Date Noted  . Coronary artery spasm ([place]) 11/02/2017  . Progressive angina ([place]) 10/17/2017  . DOE (dyspnea on exertion) 09/19/2017  . Family history of premature coronary artery disease 06/12/2017  . Chest pain with moderate risk for cardiac etiology - Persistent Sx despite Negative Ischemic Evaluation -- > and normal cath 06/12/2017  . Essential hypertension 06/12/2017  . Obesity (BMI 30.0-34.9) 06/12/2017  . Racing heart beat 06/12/2017    Past Surgical History:  Procedure Laterality Date  . CHOLECYSTECTOMY    . RIGHT/LEFT HEART CATH AND CORONARY ANGIOGRAPHY N/A 10/17/2017   Procedure: RIGHT/LEFT HEART CATH AND CORONARY ANGIOGRAPHY;  Surgeon: Leonie Man, MD;  Location: [facility];  Service: Cardiovascular;:: Normal LVEDP.  Possibly 2+ MR with normal systolic function.  Normal  right heart pressures. -->  Increased amlodipine to 10 mg daily  . TRANSTHORACIC ECHOCARDIOGRAM  09/2017   EF 60-65%.  No regional wall motion normalities.  No evidence of elevated PA pressures.  Normal valves.  Essentially normal echo  . TUBAL LIGATION       OB History   None      Home Medications    Prior to Admission medications   Medication Sig Start Date End Date Taking? Authorizing Provider  amLODipine (NORVASC) 10 MG tablet Take 1 tablet (10 mg total) by mouth daily. 10/18/17  Yes Leonie Man, MD  aspirin EC 81 MG tablet Take 81 mg by mouth daily.   Yes [provider]  levothyroxine (SYNTHROID, LEVOTHROID) 150 MCG tablet Take 150 mcg by mouth daily before breakfast.   Yes [provider]  metoprolol tartrate (LOPRESSOR) 50 MG tablet Take 50 mg by mouth 2 (two) times daily.   Yes [provider]  nitroGLYCERIN (NITROSTAT) 0.4 MG SL tablet Place 1 tablet (0.4 mg total) every 5 (five) minutes as needed under the tongue for chest pain. 07/06/17  Yes Leonie Man, MD  valACYclovir (VALTREX) 500 MG tablet Take 2,000 mg by mouth daily as needed (fever blisters).    Yes [provider]  Vitamin D, Ergocalciferol, (DRISDOL) 50000 units CAPS capsule Take 50,000 Units by mouth every 7 (seven) days. Tuesday   Yes [provider]    Family History Family History  Problem Relation Age of  Onset  . Stroke Mother   . Hypertension Mother   . Hypothyroidism Mother   . Seizures Maternal Aunt   . Aneurysm Maternal Aunt   . Heart attack Father 90  . CAD Father        stents  . Hypothyroidism Sister   . Hypertension Maternal Grandmother   . Arthritis Maternal Grandmother   . Hypothyroidism Sister     Social History Social History   Tobacco Use  . Smoking status: Never Smoker  . Smokeless tobacco: Never Used  Substance Use Topics  . Alcohol use: No  . Drug use: No     Allergies   Iodinated diagnostic agents; Codeine;  Gadolinium derivatives; and Tape   Review of Systems Review of Systems ROS: Statement: All systems negative except as marked or noted in the HPI; Constitutional: Negative for fever and chills. ; ; Eyes: Negative for eye pain, redness and discharge. ; ; ENMT: Negative for ear pain, hoarseness, nasal congestion, sinus pressure and sore throat. ; ; Cardiovascular: +CP, SOB. Negative for palpitations, diaphoresis, and peripheral edema. ; ; Respiratory: Negative for cough, wheezing and stridor. ; ; Gastrointestinal: Negative for nausea, vomiting, diarrhea, abdominal pain, blood in stool, hematemesis, jaundice and rectal bleeding. . ; ; Genitourinary: Negative for dysuria, flank pain and hematuria. ; ; Musculoskeletal: Negative for back pain and neck pain. Negative for swelling and trauma.; ; Skin: Negative for pruritus, rash, abrasions, blisters, bruising and skin lesion.; ; Neuro: Negative for headache, lightheadedness and neck stiffness. Negative for weakness, altered level of consciousness, altered mental status, extremity weakness, paresthesias, involuntary movement, seizure and syncope.       Physical Exam Updated Vital Signs BP (!) 138/92   Pulse 65   Temp 98.5 F (36.9 C) (Oral)   Resp 19   Ht 5\' 5"  (1.651 m)   Wt 103.4 kg   LMP 05/08/2018   SpO2 100%   BMI 37.94 kg/m   Physical Exam 2110: Physical examination:  Nursing notes reviewed; Vital signs and O2 SAT reviewed;  Constitutional: Well developed, Well nourished, Well hydrated, In no acute distress; Head:  Normocephalic, atraumatic; Eyes: EOMI, PERRL, No scleral icterus; ENMT: Mouth and pharynx normal, Mucous membranes moist; Neck: Supple, Full range of motion, No lymphadenopathy; Cardiovascular: Regular rate and rhythm, No gallop; Respiratory: Breath sounds clear & equal bilaterally, No wheezes.  Speaking full sentences with ease, Normal respiratory effort/excursion; Chest: Nontender, Movement normal; Abdomen: Soft, Nontender,  Nondistended, Normal bowel sounds; Genitourinary: No CVA tenderness; Extremities: Peripheral pulses normal, No tenderness, No edema, No calf edema or asymmetry.; Neuro: AA&Ox3, Major CN grossly intact.  Speech clear. No gross focal motor or sensory deficits in extremities.; Skin: Color normal, Warm, Dry.   ED Treatments / Results  Labs (all labs ordered are listed, but only abnormal results are displayed)   EKG EKG Interpretation  Date/Time:  Wednesday May 22 2018 20:46:42 EDT Ventricular Rate:  63 PR Interval:    QRS Duration: 97 QT Interval:  413 QTC Calculation: 423 R Axis:   46 Text Interpretation:  Sinus rhythm Low voltage, precordial leads RSR' in V1 or V2, probably normal variant When compared with ECG of 11/16/2017 No significant change was found Confirmed by Francine Graven [phone]) on 05/22/2018 9:16:31 PM   Radiology   Procedures Procedures (including critical care time)  Medications Ordered in ED Medications - No data to display   Initial Impression / Assessment and Plan / ED Course  I have reviewed the triage vital signs and  the nursing notes.  Pertinent labs & imaging results that were available during my care of the patient were reviewed by me and considered in my medical decision making (see chart for details).  MDM Reviewed: previous chart, nursing note and vitals Reviewed previous: labs and ECG Interpretation: labs, ECG and x-ray   Results for orders placed or performed during the hospital encounter of 11/57/26  Basic metabolic panel  Result Value Ref Range   Sodium 138 135 - 145 mmol/L   Potassium 3.8 3.5 - 5.1 mmol/L   Chloride 106 98 - 111 mmol/L   CO2 25 22 - 32 mmol/L   Glucose, Bld 102 (H) 70 - 99 mg/dL   BUN 13 6 - 20 mg/dL   Creatinine, Ser 0.86 0.44 - 1.00 mg/dL   Calcium 9.0 8.9 - 10.3 mg/dL   GFR calc non Af Amer >60 >60 mL/min   GFR calc Af Amer >60 >60 mL/min   Anion gap 7 5 - 15  CBC  Result Value Ref Range   WBC 8.7 4.0 -  10.5 K/uL   RBC 4.50 3.87 - 5.11 MIL/uL   Hemoglobin 14.1 12.0 - 15.0 g/dL   HCT 42.3 36.0 - 46.0 %   MCV 94.0 78.0 - 100.0 fL   MCH 31.3 26.0 - 34.0 pg   MCHC 33.3 30.0 - 36.0 g/dL   RDW 12.5 11.5 - 15.5 %   Platelets 235 150 - 400 K/uL  Troponin I  Result Value Ref Range   Troponin I <0.03 <0.03 ng/mL  Troponin I  Result Value Ref Range   Troponin I <0.03 <0.03 ng/mL  hCG, serum, qualitative  Result Value Ref Range   Preg, Serum NEGATIVE NEGATIVE   Dg Chest 2 View Result Date: 05/22/2018 CLINICAL DATA:  Shortness of breath and chest pain EXAM: CHEST - 2 VIEW COMPARISON:  11/16/2017 FINDINGS: Mild cardiomegaly without CHF or edema. No focal pneumonia, collapse or consolidation. Negative for effusion or pneumothorax. Trachea is midline. IMPRESSION: Cardiomegaly without acute process.  Stable exam. Electronically Signed   By: Jerilynn Mages.  Shick M.D.   On: 05/22/2018 20:01    2205:  Pt symptom free while in the ED. Pt has had extensive cardiac workup for these symptoms. Pt states her PMD "took me off imdur" but she does not know why. T/C returned from [facility] Cards Dr. Elson Areas, case discussed, including:  HPI, pertinent PM/SHx, VS/PE, dx testing, ED course and treatment:  Agrees with troponin x2 negative pt can be d/c with outpt Cards MD f/u, recommends to re-start imdur 30mg  PO qdaily.   2300:  Doubt PE as cause for symptoms with low risk [place].  Doubt ACS as cause for symptoms with normal troponin x2 and unchanged EKG from previous 9 hours after symptoms. Dx and testing, as well as d/w Cards MD, d/w pt.  Questions answered.  Verb understanding, agreeable to d/c home with outpt f/u.      Final Clinical Impressions(s) / ED Diagnoses   Final diagnoses:  None    ED Discharge Orders    None       Francine Graven, DO 05/24/18 2121

## 2018-05-22 NOTE — ED Notes (Signed)
ED Provider at bedside. 

## 2018-05-22 NOTE — ED Notes (Signed)
Patient states that she has small headache from nitro pills taken earlier today. States she is in no pain.

## 2018-06-13 ENCOUNTER — Other Ambulatory Visit: Payer: Self-pay | Admitting: Obstetrics and Gynecology

## 2018-06-27 ENCOUNTER — Other Ambulatory Visit: Payer: Self-pay | Admitting: Obstetrics and Gynecology

## 2018-06-27 NOTE — H&P (Signed)
Admission History and Physical Exam for a Gynecology Patient  Tammie Powell is a 47 y.o. female, No obstetric history on file., who presents for a cold knife conization of the cervix.  The patient's Pap smear of 2019 was negative for intraepithelial neoplasia and malignancy.  Her high risk HPV test and her high risk HPV 16, 18 were positive.  Colposcopy and biopsies were performed.  The biopsy of the ectocervix was negative.  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 She has been followed at the [facility] Gynecology division of [facility].  OB History   None     Past Medical History:  Diagnosis Date  . Hypertension   . Thyroid disease     No medications prior to admission.    Past Surgical History:  Procedure Laterality Date  . CHOLECYSTECTOMY    . RIGHT/LEFT HEART CATH AND CORONARY ANGIOGRAPHY N/A 10/17/2017   Procedure: RIGHT/LEFT HEART CATH AND CORONARY ANGIOGRAPHY;  Surgeon: Leonie Man, MD;  Location: [place] [facility];  Service: Cardiovascular;:: Normal LVEDP.  Possibly 2+ MR with normal systolic function.  Normal right heart pressures. -->  Increased amlodipine to 10 mg daily  . TRANSTHORACIC ECHOCARDIOGRAM  09/2017   EF 60-65%.  No regional wall motion normalities.  No evidence of elevated PA pressures.  Normal valves.  Essentially normal echo  . TUBAL LIGATION      Allergies  Allergen Reactions  . Iodinated Diagnostic Agents Itching  . Codeine Hives, Itching and Other (See Comments)    burning  . Gadolinium Derivatives Hives and Other (See Comments)    Patient had a few hives that were mildly itchy. Checked by dr Carlis Abbott, since severity was so low, and patient had no driver, he recommended she take benadryl at home. She remained in office for 40 minutes post injection to be observed.   . Tape Swelling and Rash   Medication:  Furosemide, Synthroid, Motrin.  Family History:  family history includes Aneurysm in her maternal aunt; Arthritis in her maternal grandmother; CAD in her father; Heart attack (age of onset: 57) in her father; Hypertension in her maternal grandmother and mother; Hypothyroidism in her mother, sister, and sister; Seizures in her maternal aunt; Stroke in her mother.  Social History:  reports that she has never smoked. She has never used smokeless tobacco. She reports that she does not drink alcohol or use drugs.  Review of systems: See HPI.  Admission Physical Exam:    BMI equals 39.9  There were no vitals taken for this visit.  HEENT:                 Within normal limits Chest:                   Clear Heart:                    Regular rate and rhythm Breasts:                No masses, skin changes, bleeding, or discharge present Abdomen:             Nontender, no masses Extremities:          Grossly normal Neurologic exam: Grossly normal  Pelvic exam:  External genitalia: normal general appearance Vaginal: normal mucosa without prolapse or lesions Cervix: normal appearance Adnexa: normal bimanual exam Uterus: Upper limits normal size, shape, consistency  Assessment:  CIN-3 of  the endocervix  High risk HPV  Hypothyroidism  Obesity  Plan:  The patient will undergo a cold knife conization of the cervix.  She understands the indications for surgical procedure.  She accepts the risk of, but not limited to, anesthetic complications, bleeding, infections, and possible damage to the surrounding organs.   Eli Hose 06/27/2018

## 2018-06-28 ENCOUNTER — Ambulatory Visit (HOSPITAL_COMMUNITY): Payer: BLUE CROSS/BLUE SHIELD | Admitting: Certified Registered Nurse Anesthetist

## 2018-06-28 ENCOUNTER — Encounter (HOSPITAL_COMMUNITY)
Admission: RE | Disposition: A | Discharge status: Home / Self Care | Payer: Self-pay | Source: Ambulatory Visit | Attending: Obstetrics and Gynecology

## 2018-06-28 ENCOUNTER — Encounter (HOSPITAL_COMMUNITY): Payer: Self-pay | Admitting: Certified Registered Nurse Anesthetist

## 2018-06-28 ENCOUNTER — Other Ambulatory Visit: Payer: Self-pay

## 2018-06-28 ENCOUNTER — Ambulatory Visit (HOSPITAL_COMMUNITY)
Admission: RE | Admit: 2018-06-28 | Discharge: 2018-06-28 | Disposition: A | Discharge status: Home / Self Care | Payer: BLUE CROSS/BLUE SHIELD | Source: Ambulatory Visit | Attending: Obstetrics and Gynecology | Admitting: Obstetrics and Gynecology

## 2018-06-28 DIAGNOSIS — Z888 Allergy status to other drugs, medicaments and biological substances status: Secondary | ICD-10-CM | POA: Diagnosis not present

## 2018-06-28 DIAGNOSIS — Z91041 Radiographic dye allergy status: Secondary | ICD-10-CM | POA: Insufficient documentation

## 2018-06-28 DIAGNOSIS — E039 Hypothyroidism, unspecified: Secondary | ICD-10-CM | POA: Insufficient documentation

## 2018-06-28 DIAGNOSIS — I25119 Atherosclerotic heart disease of native coronary artery with unspecified angina pectoris: Secondary | ICD-10-CM | POA: Insufficient documentation

## 2018-06-28 DIAGNOSIS — E669 Obesity, unspecified: Secondary | ICD-10-CM | POA: Diagnosis not present

## 2018-06-28 DIAGNOSIS — Z91048 Other nonmedicinal substance allergy status: Secondary | ICD-10-CM | POA: Insufficient documentation

## 2018-06-28 DIAGNOSIS — Z8349 Family history of other endocrine, nutritional and metabolic diseases: Secondary | ICD-10-CM | POA: Diagnosis not present

## 2018-06-28 DIAGNOSIS — Z6839 Body mass index (BMI) 39.0-39.9, adult: Secondary | ICD-10-CM | POA: Diagnosis not present

## 2018-06-28 DIAGNOSIS — Z823 Family history of stroke: Secondary | ICD-10-CM | POA: Insufficient documentation

## 2018-06-28 DIAGNOSIS — I10 Essential (primary) hypertension: Secondary | ICD-10-CM | POA: Diagnosis not present

## 2018-06-28 DIAGNOSIS — Z885 Allergy status to narcotic agent status: Secondary | ICD-10-CM | POA: Insufficient documentation

## 2018-06-28 DIAGNOSIS — Z820 Family history of epilepsy and other diseases of the nervous system: Secondary | ICD-10-CM | POA: Diagnosis not present

## 2018-06-28 DIAGNOSIS — Z8261 Family history of arthritis: Secondary | ICD-10-CM | POA: Insufficient documentation

## 2018-06-28 DIAGNOSIS — Z9049 Acquired absence of other specified parts of digestive tract: Secondary | ICD-10-CM | POA: Insufficient documentation

## 2018-06-28 DIAGNOSIS — G709 Myoneural disorder, unspecified: Secondary | ICD-10-CM | POA: Diagnosis not present

## 2018-06-28 DIAGNOSIS — R0609 Other forms of dyspnea: Secondary | ICD-10-CM | POA: Diagnosis not present

## 2018-06-28 DIAGNOSIS — Z8249 Family history of ischemic heart disease and other diseases of the circulatory system: Secondary | ICD-10-CM | POA: Diagnosis not present

## 2018-06-28 DIAGNOSIS — Z79899 Other long term (current) drug therapy: Secondary | ICD-10-CM | POA: Insufficient documentation

## 2018-06-28 DIAGNOSIS — D069 Carcinoma in situ of cervix, unspecified: Secondary | ICD-10-CM | POA: Diagnosis not present

## 2018-06-28 HISTORY — PX: CERVICAL CONIZATION W/BX: SHX1330

## 2018-06-28 LAB — CBC
HCT: 40.6 % (ref 36.0–46.0)
Hemoglobin: 13.3 g/dL (ref 12.0–15.0)
MCH: 30.7 pg (ref 26.0–34.0)
MCHC: 32.8 g/dL (ref 30.0–36.0)
MCV: 93.8 fL (ref 80.0–100.0)
Platelets: 239 10*3/uL (ref 150–400)
RBC: 4.33 MIL/uL (ref 3.87–5.11)
RDW: 12.5 % (ref 11.5–15.5)
WBC: 7.5 10*3/uL (ref 4.0–10.5)
nRBC: 0 % (ref 0.0–0.2)

## 2018-06-28 LAB — PREGNANCY, URINE: PREG TEST UR: NEGATIVE

## 2018-06-28 SURGERY — CONE BIOPSY, CERVIX
Anesthesia: General | Site: Vagina

## 2018-06-28 MED ORDER — PROPOFOL 10 MG/ML IV BOLUS
INTRAVENOUS | Status: AC
  Filled 2018-06-28: qty 20

## 2018-06-28 MED ORDER — PROPOFOL 10 MG/ML IV BOLUS
INTRAVENOUS | Status: DC | PRN
  Administered 2018-06-28: 150 mg via INTRAVENOUS

## 2018-06-28 MED ORDER — METOCLOPRAMIDE HCL 5 MG/ML IJ SOLN: 10.0000 mg | Freq: Once | INTRAMUSCULAR | Status: DC | PRN

## 2018-06-28 MED ORDER — CEFAZOLIN SODIUM-DEXTROSE 2-4 GM/100ML-% IV SOLN
2.0000 g | INTRAVENOUS | Status: AC
  Administered 2018-06-28: 2 g via INTRAVENOUS

## 2018-06-28 MED ORDER — SCOPOLAMINE 1 MG/3DAYS TD PT72
MEDICATED_PATCH | TRANSDERMAL | Status: AC
  Administered 2018-06-28: 1.5 mg via TRANSDERMAL
  Filled 2018-06-28: qty 1

## 2018-06-28 MED ORDER — CEFAZOLIN SODIUM-DEXTROSE 2-4 GM/100ML-% IV SOLN
INTRAVENOUS | Status: AC
  Filled 2018-06-28: qty 100

## 2018-06-28 MED ORDER — BUPIVACAINE-EPINEPHRINE (PF) 0.5% -1:200000 IJ SOLN
INTRAMUSCULAR | Status: AC
  Filled 2018-06-28: qty 30

## 2018-06-28 MED ORDER — LIDOCAINE HCL (CARDIAC) PF 100 MG/5ML IV SOSY
PREFILLED_SYRINGE | INTRAVENOUS | Status: AC
  Filled 2018-06-28: qty 5

## 2018-06-28 MED ORDER — FENTANYL CITRATE (PF) 100 MCG/2ML IJ SOLN
INTRAMUSCULAR | Status: DC | PRN
  Administered 2018-06-28 (×2): 50 ug via INTRAVENOUS

## 2018-06-28 MED ORDER — KETOROLAC TROMETHAMINE 30 MG/ML IJ SOLN
INTRAMUSCULAR | Status: AC
  Filled 2018-06-28: qty 1

## 2018-06-28 MED ORDER — DEXAMETHASONE SODIUM PHOSPHATE 10 MG/ML IJ SOLN
INTRAMUSCULAR | Status: DC | PRN
  Administered 2018-06-28: 4 mg via INTRAVENOUS

## 2018-06-28 MED ORDER — ONDANSETRON HCL 4 MG/2ML IJ SOLN
INTRAMUSCULAR | Status: AC
  Filled 2018-06-28: qty 2

## 2018-06-28 MED ORDER — ACETIC ACID 4% SOLUTION
Status: DC | PRN
  Administered 2018-06-28: 1 via TOPICAL

## 2018-06-28 MED ORDER — MEPERIDINE HCL 25 MG/ML IJ SOLN: 6.2500 mg | INTRAMUSCULAR | Status: DC | PRN

## 2018-06-28 MED ORDER — ACETIC ACID 5 % SOLN
Status: AC
  Filled 2018-06-28: qty 500

## 2018-06-28 MED ORDER — DEXAMETHASONE SODIUM PHOSPHATE 4 MG/ML IJ SOLN
INTRAMUSCULAR | Status: AC
  Filled 2018-06-28: qty 1

## 2018-06-28 MED ORDER — VASOPRESSIN 20 UNIT/ML IV SOLN
INTRAVENOUS | Status: AC
  Filled 2018-06-28: qty 1

## 2018-06-28 MED ORDER — FENTANYL CITRATE (PF) 100 MCG/2ML IJ SOLN
INTRAMUSCULAR | Status: AC
  Filled 2018-06-28: qty 2

## 2018-06-28 MED ORDER — LACTATED RINGERS IV SOLN
INTRAVENOUS | Status: DC
  Administered 2018-06-28: 125 mL/h via INTRAVENOUS
  Administered 2018-06-28: 13:00:00 via INTRAVENOUS

## 2018-06-28 MED ORDER — LIDOCAINE HCL (CARDIAC) PF 100 MG/5ML IV SOSY
PREFILLED_SYRINGE | INTRAVENOUS | Status: DC | PRN
  Administered 2018-06-28: 50 mg via INTRAVENOUS

## 2018-06-28 MED ORDER — FENTANYL CITRATE (PF) 100 MCG/2ML IJ SOLN: 25.0000 ug | INTRAMUSCULAR | Status: DC | PRN

## 2018-06-28 MED ORDER — IODINE STRONG (LUGOLS) 5 % PO SOLN
ORAL | Status: AC
  Filled 2018-06-28: qty 1

## 2018-06-28 MED ORDER — SCOPOLAMINE 1 MG/3DAYS TD PT72
1.0000 | MEDICATED_PATCH | Freq: Once | TRANSDERMAL | Status: DC
  Administered 2018-06-28: 1.5 mg via TRANSDERMAL

## 2018-06-28 MED ORDER — MIDAZOLAM HCL 2 MG/2ML IJ SOLN
INTRAMUSCULAR | Status: AC
  Filled 2018-06-28: qty 2

## 2018-06-28 MED ORDER — ONDANSETRON HCL 4 MG/2ML IJ SOLN
INTRAMUSCULAR | Status: DC | PRN
  Administered 2018-06-28: 4 mg via INTRAVENOUS

## 2018-06-28 MED ORDER — MIDAZOLAM HCL 2 MG/2ML IJ SOLN
INTRAMUSCULAR | Status: DC | PRN
  Administered 2018-06-28: 2 mg via INTRAVENOUS

## 2018-06-28 MED ORDER — FERRIC SUBSULFATE 259 MG/GM EX SOLN
CUTANEOUS | Status: AC
  Filled 2018-06-28: qty 8

## 2018-06-28 MED ORDER — KETOROLAC TROMETHAMINE 30 MG/ML IJ SOLN
INTRAMUSCULAR | Status: DC | PRN
  Administered 2018-06-28: 30 mg via INTRAVENOUS
  Administered 2018-06-28: 30 mg via INTRAMUSCULAR

## 2018-06-28 MED ORDER — SODIUM CHLORIDE 0.9 % IJ SOLN
INTRAMUSCULAR | Status: AC
  Filled 2018-06-28: qty 50

## 2018-06-28 MED ORDER — BUPIVACAINE-EPINEPHRINE 0.5% -1:200000 IJ SOLN
INTRAMUSCULAR | Status: DC | PRN
  Administered 2018-06-28: 20 mL
  Administered 2018-06-28: 10 mL

## 2018-06-28 SURGICAL SUPPLY — 29 items
APL SWBSTK 6 STRL LF DISP (MISCELLANEOUS) ×1
APPLICATOR COTTON TIP 6 STRL (MISCELLANEOUS) IMPLANT
APPLICATOR COTTON TIP 6IN STRL (MISCELLANEOUS) ×3
BLADE SURG 11 STRL SS (BLADE) ×3 IMPLANT
CATH ROBINSON RED A/P 16FR (CATHETERS) ×3 IMPLANT
CLEANER TIP ELECTROSURG 2X2 (MISCELLANEOUS) ×3 IMPLANT
ELECT REM PT RETURN 9FT ADLT (ELECTROSURGICAL) ×3
ELECTRODE REM PT RTRN 9FT ADLT (ELECTROSURGICAL) IMPLANT
GLOVE BIOGEL PI IND STRL 7.0 (GLOVE) ×1 IMPLANT
GLOVE BIOGEL PI IND STRL 8.5 (GLOVE) ×1 IMPLANT
GLOVE BIOGEL PI INDICATOR 7.0 (GLOVE) ×2
GLOVE BIOGEL PI INDICATOR 8.5 (GLOVE) ×2
GLOVE ECLIPSE 8.0 STRL XLNG CF (GLOVE) ×3 IMPLANT
GOWN STRL REUS W/TWL LRG LVL3 (GOWN DISPOSABLE) ×6 IMPLANT
NS IRRIG 1000ML POUR BTL (IV SOLUTION) ×3 IMPLANT
PACK VAGINAL MINOR WOMEN LF (CUSTOM PROCEDURE TRAY) ×3 IMPLANT
PAD OB MATERNITY 4.3X12.25 (PERSONAL CARE ITEMS) ×3 IMPLANT
PENCIL BUTTON BLDE SNGL 10FT (ELECTRODE) ×2 IMPLANT
PENCIL BUTTON HOLSTER BLD 10FT (ELECTRODE) ×2 IMPLANT
PENCIL SMOKE EVAC W/HOLSTER (ELECTROSURGICAL) IMPLANT
SCOPETTES 8  STERILE (MISCELLANEOUS) ×4
SCOPETTES 8 STERILE (MISCELLANEOUS) ×2 IMPLANT
SPONGE SURGIFOAM ABS GEL 12-7 (HEMOSTASIS) ×2 IMPLANT
SUT VICRYL 0 UR6 27IN ABS (SUTURE) ×6 IMPLANT
SYR TB 1ML 25GX5/8 (SYRINGE) IMPLANT
TOWEL OR 17X24 6PK STRL BLUE (TOWEL DISPOSABLE) ×6 IMPLANT
TUBING NON-CON 1/4 X 20 CONN (TUBING) IMPLANT
TUBING NON-CON 1/4 X 20' CONN (TUBING)
YANKAUER SUCT BULB TIP NO VENT (SUCTIONS) ×2 IMPLANT

## 2018-06-28 NOTE — Op Note (Signed)
Cold Knife Conization Of the Cervix  Tammie Powell  DOB:    April 12, 1971  MRN:    [id number]  CSN:    [id number]  Date of Surgery:  06/28/2018  Preoperative Diagnosis:  CIN 3  Postoperative Diagnosis:  CIN 3  Procedure:  Cold Knife Conization of the Cervix  Surgeon:  Gildardo Cranker, M D.  Assistant:  None  Anesthetic:  General  Disposition:  The patient presents with a history of CIN 3 on colposcpy. She understands the indications for her cold by conization of the cervix as well as the alternative treatment options. She accepts the risk of, but not limited to, anesthetic complications, bleeding, infections, and possible damage to the surrounding organs.  Findings:  Normal size uterus. No adnexal masses. No paramertial thickening. Only a small area of non-staining tissue around the os with Lugol's Solution.  Procedure:  The patient was taken to the operating room where a general anesthetic was given. A timeout was performed identifying the patient and the procedure. The patient was placed in a lithotomy position. The perineum and vagina were prepped with multiple layers of chlorhexidene. The bladder was drained of urine. The patient was sterilely draped. An examination under anesthesia was performed. A paracervical block was placed using 10 cc of half percent Marcaine with epinephrine. Lugol solution was placed on the cervix. The cervix was then injected with 20 cc's of the same mixture. Angle sutures were placed at the 3:00 and 9:00 positions. The endocervix was sounded. A circumferential incision was made around the cervical os incorporating all nonstaining areas from the Lugol solution. The incision was continued in a conelike fashion to the endocervix. A stitch was placed at the 12:00 position of the conization specimen. The specimen was sent to pathology. Inverting sutures were placed at the 12:00, 3:00, 6:00, and 9:00 positions. Hemostasis was adequate.  Examination was repeated. The cervix was firm. All instruments were removed from the vagina. The patient was returned to the supine position. She was awakened from her anesthetic without difficulty. She was transported to the recovery room in stable condition. Sponge and needle counts were correct. The estimated blood loss for the procedure was 10 cc's. The conization specimen was sent to pathology.  Followup instructions:  The patient will return to see Dr. Raphael Gibney in 2 weeks for follow-up examination. She will call for questions or concerns. She will call for complications.  Discharge medications:  Motrin and Zofran (given preop).  Gildardo Cranker, M.D.  06/28/2018

## 2018-06-28 NOTE — Anesthesia Preprocedure Evaluation (Addendum)
Anesthesia Evaluation  Patient identified by MRN, date of birth, ID band Patient awake    Reviewed: Allergy & Precautions, NPO status , Patient's Chart, lab work & pertinent test results  Airway Mallampati: III  TM Distance: >3 FB Neck ROM: Full    Dental no notable dental hx. (+) Upper Dentures   Pulmonary neg pulmonary ROS,    breath sounds clear to auscultation       Cardiovascular hypertension, Pt. on medications and Pt. on home beta blockers + angina with exertion + CAD and + DOE  Normal cardiovascular exam Rhythm:Regular Rate:Normal     Neuro/Psych  Neuromuscular disease negative psych ROS   GI/Hepatic negative GI ROS, Neg liver ROS,   Endo/Other  Hypothyroidism Obesity  Renal/GU negative Renal ROS  negative genitourinary   Musculoskeletal negative musculoskeletal ROS (+)   Abdominal (+) + obese,   Peds  Hematology negative hematology ROS (+)   Anesthesia Other Findings   Reproductive/Obstetrics CIN III                           Anesthesia Physical Anesthesia Plan  ASA: III  Anesthesia Plan: General   Post-op Pain Management:    Induction:   PONV Risk Score and Plan: 3 and Ondansetron, Dexamethasone, Midazolam and Treatment may vary due to age or medical condition  Airway Management Planned: LMA  Additional Equipment:   Intra-op Plan:   Post-operative Plan: Extubation in OR  Informed Consent: I have reviewed the patients History and Physical, chart, labs and discussed the procedure including the risks, benefits and alternatives for the proposed anesthesia with the patient or authorized representative who has indicated his/her understanding and acceptance.   Dental advisory given  Plan Discussed with: CRNA and Surgeon  Anesthesia Plan Comments:         Anesthesia Quick Evaluation

## 2018-06-28 NOTE — Transfer of Care (Signed)
Immediate Anesthesia Transfer of Care Note  Patient: Tammie Powell  Procedure(s) Performed: CONIZATION CERVIX WITH BIOPSY (N/A Vagina )  Patient Location: PACU  Anesthesia Type:General  Level of Consciousness: awake, alert  and oriented  Airway & Oxygen Therapy: Patient Spontanous Breathing and Patient connected to nasal cannula oxygen  Post-op Assessment: Report given to RN and Post -op Vital signs reviewed and stable  Post vital signs: Reviewed and stable  Last Vitals:  Vitals Value Taken Time  BP 129/90 06/28/2018  1:02 PM  Temp    Pulse 72 06/28/2018  1:03 PM  Resp 16 06/28/2018  1:03 PM  SpO2 100 % 06/28/2018  1:03 PM  Vitals shown include unvalidated device data.  Last Pain:  Vitals:   06/28/18 1131  TempSrc: Oral  PainSc: 0-No pain      Patients Stated Pain Goal: 6 (51/70/01 7494)  Complications: No apparent anesthesia complications

## 2018-06-28 NOTE — Discharge Instructions (Signed)
DISCHARGE INSTRUCTIONS: D&C / D&E °The following instructions have been prepared to help you care for yourself upon your return home. °  °Personal hygiene: °• Use sanitary pads for vaginal drainage, not tampons. °• Shower the day after your procedure. °• NO tub baths, pools or Jacuzzis for 2-3 weeks. °• Wipe front to back after using the bathroom. ° °Activity and limitations: °• Do NOT drive or operate any equipment for 24 hours. The effects of anesthesia are still present and drowsiness may result. °• Do NOT rest in bed all day. °• Walking is encouraged. °• Walk up and down stairs slowly. °• You may resume your normal activity in one to two days or as indicated by your physician. ° °Sexual activity: NO intercourse for at least 2 weeks after the procedure, or as indicated by your physician. ° °Diet: Eat a light meal as desired this evening. You may resume your usual diet tomorrow. ° °Return to work: You may resume your work activities in one to two days or as indicated by your doctor. ° °What to expect after your surgery: Expect to have vaginal bleeding/discharge for 2-3 days and spotting for up to 10 days. It is not unusual to have soreness for up to 1-2 weeks. You may have a slight burning sensation when you urinate for the first day. Mild cramps may continue for a couple of days. You may have a regular period in 2-6 weeks. ° °Call your doctor for any of the following: °• Excessive vaginal bleeding, saturating and changing one pad every hour. °• Inability to urinate 6 hours after discharge from hospital. °• Pain not relieved by pain medication. °• Fever of 100.4° F or greater. °• Unusual vaginal discharge or odor. ° ° Call for an appointment:  ° ° °Patient’s signature: ______________________ ° °Nurse’s signature ________________________ ° °Support person's signature_______________________ ° ° °DISCHARGE INSTRUCTIONS: D&C / D&E °The following instructions have been prepared to help you care for yourself upon your  return home. °  °Personal hygiene: °• Use sanitary pads for vaginal drainage, not tampons. °• Shower the day after your procedure. °• NO tub baths, pools or Jacuzzis for 2-3 weeks. °• Wipe front to back after using the bathroom. ° °Activity and limitations: °• Do NOT drive or operate any equipment for 24 hours. The effects of anesthesia are still present and drowsiness may result. °• Do NOT rest in bed all day. °• Walking is encouraged. °• Walk up and down stairs slowly. °• You may resume your normal activity in one to two days or as indicated by your physician. ° °Sexual activity: NO intercourse for at least 2 weeks after the procedure, or as indicated by your physician. ° °Diet: Eat a light meal as desired this evening. You may resume your usual diet tomorrow. ° °Return to work: You may resume your work activities in one to two days or as indicated by your doctor. ° °What to expect after your surgery: Expect to have vaginal bleeding/discharge for 2-3 days and spotting for up to 10 days. It is not unusual to have soreness for up to 1-2 weeks. You may have a slight burning sensation when you urinate for the first day. Mild cramps may continue for a couple of days. You may have a regular period in 2-6 weeks. ° °Call your doctor for any of the following: °• Excessive vaginal bleeding, saturating and changing one pad every hour. °• Inability to urinate 6 hours after discharge from hospital. °• Pain not relieved by   pain medication.  Fever of 100.4 F or greater.  Unusual vaginal discharge or odor.   Call for an appointment:    Patients signature: ______________________  Nurses signature ________________________  Support person's signature_______________________   Cervical Conization, Care After This sheet gives you information about how to care for yourself after your procedure. Your doctor may also give you more specific instructions. If you have problems or questions, contact your doctor. Follow  these instructions at home: Medicines  Take over-the-counter and prescription medicines only as told by your doctor.  Do not take aspirin until your doctor says it is okay.  If you take pain medicine: ? You may have constipation. To help treat this, your doctor may tell you to:  Drink enough fluid to keep your pee (urine) clear or pale yellow.  Take medicines.  Eat foods that are high in fiber. These include fresh fruits and vegetables, whole grains, bran, and beans.  Limit foods that are high in fat and sugar. These include fried foods and sweet foods. ? Do not drive or use heavy machines. General instructions  You can eat your usual diet unless your doctor tells you not to do so.  Take showers for the first week. Do not take baths, swim, or use hot tubs until your doctor says it is okay.  Do not douche, use tampons, or have sex until your doctor says it is okay.  For 7-14 days after your procedure, avoid: ? Being very active. ? Exercising. ? Heavy lifting.  Keep all follow-up visits as told by your doctor. This is important. Contact a doctor if:  You have a rash.  You are dizzy or lightheaded.  You feel sick to your stomach (nauseous).  You throw up (vomit).  You have fluid from your vagina (vaginal discharge) that smells bad. Get help right away if:  There are blood clots coming from your vagina.  You have more bleeding than you would have in a normal period. For example, you soak a pad in less than 1 hour.  You have a fever.  You have more and more cramps.  You pass out (faint).  You have pain when peeing.  Your have a lot of pain.  Your pain gets worse.  Your pain does not get better when you take your medicine.  You have blood in your pee.  You throw up (vomit). Summary  After your procedure, take over-the-counter and prescription medicines only as told by your doctor.  Do not douche, use tampons, or have sex until your doctor says it is  okay.  For about 7-14 days after your procedure, try not to exercise or lift heavy objects.  Get help right away if you have new symptoms, or if your symptoms become worse. This information is not intended to replace advice given to you by your health care provider. Make sure you discuss any questions you have with your health care provider. Document Released: 05/23/2008 Document Revised: 08/16/2016 Document Reviewed: 08/16/2016 Elsevier Interactive Patient Education  2017 [employer].

## 2018-06-28 NOTE — Progress Notes (Signed)
The patient was interviewed and examined today.  The previously documented history and physical examination was reviewed. There are no changes. The operative procedure was reviewed. The risks and benefits were outlined again. The specific risks include, but are not limited to, anesthetic complications, bleeding, infections, and possible damage to the surrounding organs. The patient's questions were answered.  We are ready to proceed as outlined. The likelihood of the patient achieving the goals of this procedure is very likely.   BP (!) 132/98   Pulse 69   Temp 98.2 F (36.8 C) (Oral)   Resp 16   Ht 5\' 5"  (1.651 m)   Wt 106.4 kg   SpO2 99%   BMI 39.02 kg/m   CBC    Component Value Date/Time   WBC 7.5 06/28/2018 1110   RBC 4.33 06/28/2018 1110   HGB 13.3 06/28/2018 1110   HGB 12.7 10/12/2017 1419   HCT 40.6 06/28/2018 1110   HCT 37.6 10/12/2017 1419   PLT 239 06/28/2018 1110   PLT 238 10/12/2017 1419   MCV 93.8 06/28/2018 1110   MCV 92 10/12/2017 1419   MCH 30.7 06/28/2018 1110   MCHC 32.8 06/28/2018 1110   RDW 12.5 06/28/2018 1110   RDW 13.3 10/12/2017 1419   LYMPHSABS 1.9 12/03/2015 2142   MONOABS 0.7 12/03/2015 2142   EOSABS 0.3 12/03/2015 2142   BASOSABS 0.0 12/03/2015 2142    Gildardo Cranker, M.D.

## 2018-06-28 NOTE — Anesthesia Postprocedure Evaluation (Signed)
Anesthesia Post Note  Patient: Tammie Powell  Procedure(s) Performed: CONIZATION CERVIX WITH BIOPSY (N/A Vagina )     Patient location during evaluation: PACU Anesthesia Type: General Level of consciousness: awake and alert and oriented Pain management: pain level controlled Vital Signs Assessment: post-procedure vital signs reviewed and stable Respiratory status: spontaneous breathing, nonlabored ventilation and respiratory function stable Cardiovascular status: blood pressure returned to baseline and stable Postop Assessment: no apparent nausea or vomiting Anesthetic complications: no    Last Vitals:  Vitals:   06/28/18 1345 06/28/18 1346  BP: (!) 155/83   Pulse: 66 69  Resp: 15 12  Temp:  36.7 C  SpO2: 100% 100%    Last Pain:  Vitals:   06/28/18 1346  TempSrc: Oral  PainSc:    Pain Goal: Patients Stated Pain Goal: 6 (06/28/18 1131)               Brittin Janik A.

## 2018-06-28 NOTE — Anesthesia Procedure Notes (Signed)
Procedure Name: LMA Insertion Date/Time: 06/28/2018 12:12 PM Performed by: Bufford Spikes, CRNA Pre-anesthesia Checklist: Patient identified, Emergency Drugs available, Suction available and Patient being monitored Patient Re-evaluated:Patient Re-evaluated prior to induction Oxygen Delivery Method: Circle system utilized Preoxygenation: Pre-oxygenation with 100% oxygen Induction Type: IV induction Ventilation: Mask ventilation without difficulty LMA: LMA inserted LMA Size: 4.0 Number of attempts: 1 Airway Equipment and Method: Bite block Placement Confirmation: positive ETCO2 Tube secured with: Tape Dental Injury: Teeth and Oropharynx as per pre-operative assessment

## 2018-06-29 ENCOUNTER — Encounter (HOSPITAL_COMMUNITY): Payer: Self-pay | Admitting: Obstetrics and Gynecology

## 2018-08-02 NOTE — Patient Instructions (Signed)
Tammie Powell  08/02/2018      Your procedure is scheduled on  08-09-18   Report to [place]  at  7:15A.M.    Call this number if you have problems the morning of surgery:[phone]    OUR ADDRESS IS [place], WE ARE LOCATED [facility] WITH [facility].   Remember:  Do not eat food or drink liquids after midnight.  Take these medicines the morning of surgery with A SIP OF WATER: AMLODIPINE, IMDUR, LEVOTHYROXINE, METOPROLOL   Do not wear jewelry, make-up or nail polish.  Do not wear lotions, powders, or perfumes, or deoderant.  Do not shave 48 hours prior to surgery.  Men may shave face and neck.  Do not bring valuables to the hospital.  [facility] is not responsible for any belongings or valuables.  Contacts, dentures or bridgework may not be worn into surgery.  Leave your suitcase in the car.  After surgery it may be brought to your room.  For patients admitted to the hospital, discharge time will be determined by your treatment team.  Patients discharged the day of surgery will not be allowed to drive home.   Special instructions:  N/A  Please read over the following fact sheets that you were given:      [facility] - Preparing for Surgery Before surgery, you can play an important role.  Because skin is not sterile, your skin needs to be as free of germs as possible.  You can reduce the number of germs on your skin by washing with CHG (chlorahexidine gluconate) soap before surgery.  CHG is an antiseptic cleaner which kills germs and bonds with the skin to continue killing germs even after washing. Please DO NOT use if you have an allergy to CHG or antibacterial soaps.  If your skin becomes reddened/irritated stop using the CHG and inform your nurse when you arrive at Short Stay. Do not shave (including legs and underarms) for at least 48 hours prior to the first CHG shower.  You may shave your face/neck. Please follow  these instructions carefully:  1.  Shower with CHG Soap the night before surgery and the  morning of Surgery.  2.  If you choose to wash your hair, wash your hair first as usual with your  normal  shampoo.  3.  After you shampoo, rinse your hair and body thoroughly to remove the  shampoo.                           4.  Use CHG as you would any other liquid soap.  You can apply chg directly  to the skin and wash                       Gently with a scrungie or clean washcloth.  5.  Apply the CHG Soap to your body ONLY FROM THE NECK DOWN.   Do not use on face/ open                           Wound or open sores. Avoid contact with eyes, ears mouth and genitals (private parts).                       Wash face,  Genitals (private parts) with your normal soap.  6.  Wash thoroughly, paying special attention to the area where your surgery  will be performed.  7.  Thoroughly rinse your body with warm water from the neck down.  8.  DO NOT shower/wash with your normal soap after using and rinsing off  the CHG Soap.                9.  Pat yourself dry with a clean towel.            10.  Wear clean pajamas.            11.  Place clean sheets on your bed the night of your first shower and do not  sleep with pets. Day of Surgery : Do not apply any lotions/deodorants the morning of surgery.  Please wear clean clothes to the hospital/surgery center.  FAILURE TO FOLLOW THESE INSTRUCTIONS MAY RESULT IN THE CANCELLATION OF YOUR SURGERY PATIENT SIGNATURE_________________________________  NURSE SIGNATURE__________________________________  ________________________________________________________________________

## 2018-08-02 NOTE — Progress Notes (Signed)
[facility] 09-19-17 Epic   EKG 05-23-18 Epic   CXR 05-22-18 Epic   ECHO 10-04-17 Epic

## 2018-08-05 ENCOUNTER — Inpatient Hospital Stay (HOSPITAL_COMMUNITY)
Admission: RE | Admit: 2018-08-05 | Discharge: 2018-08-05 | Disposition: A | Discharge status: Home / Self Care | Payer: BLUE CROSS/BLUE SHIELD | Source: Ambulatory Visit

## 2018-08-06 NOTE — Patient Instructions (Addendum)
           Mei A Purdy  08/06/2018      Your procedure is scheduled on12/13/2019   Report to [place].M.  Call this number if you have problems the morning of surgery:[phone]  OUR ADDRESS IS [place], WE ARE [facility]ATED [facility].   Remember:  Do not eat food or drink liquids after midnight.  Take these medicines the morning of surgery with A SIP OF WATERAmlodipine, imdur, Levothyroid, Metoprolol     Do not wear jewelry, make-up or nail polish.  Do not wear lotions, powders, or perfumes, or deoderant.  Do not shave 48 hours prior to surgery.  Men may shave face and neck.  Do not bring valuables to the hospital.  [facility] is not responsible for any belongings or valuables.  Contacts, dentures or bridgework may not be worn into surgery.  Leave your suitcase in the car.  After surgery it may be brought to your room.  For patients admitted to the hospital, discharge time will be determined by your treatment team.  Patients discharged the day of surgery will not be allowed to drive home.   Special instructions: none   Please read over the following fact sheets that you were given:

## 2018-08-07 ENCOUNTER — Inpatient Hospital Stay (HOSPITAL_COMMUNITY): Admission: RE | Admit: 2018-08-07 | Payer: BLUE CROSS/BLUE SHIELD | Source: Ambulatory Visit

## 2018-08-08 ENCOUNTER — Encounter (HOSPITAL_COMMUNITY): Payer: Self-pay

## 2018-08-08 ENCOUNTER — Other Ambulatory Visit: Payer: Self-pay

## 2018-08-08 ENCOUNTER — Encounter (HOSPITAL_COMMUNITY)
Admission: RE | Admit: 2018-08-08 | Discharge: 2018-08-08 | Disposition: A | Discharge status: Home / Self Care | Payer: BLUE CROSS/BLUE SHIELD | Source: Ambulatory Visit | Attending: Obstetrics and Gynecology | Admitting: Obstetrics and Gynecology

## 2018-08-08 DIAGNOSIS — Z01812 Encounter for preprocedural laboratory examination: Secondary | ICD-10-CM | POA: Diagnosis present

## 2018-08-08 HISTORY — DX: Nausea with vomiting, unspecified: R11.2

## 2018-08-08 HISTORY — DX: Other complications of anesthesia, initial encounter: T88.59XA

## 2018-08-08 HISTORY — DX: Other specified postprocedural states: Z98.890

## 2018-08-08 HISTORY — DX: Gastro-esophageal reflux disease without esophagitis: K21.9

## 2018-08-08 HISTORY — DX: Adverse effect of unspecified anesthetic, initial encounter: T41.45XA

## 2018-08-08 HISTORY — DX: Hypothyroidism, unspecified: E03.9

## 2018-08-08 LAB — BASIC METABOLIC PANEL
Anion gap: 6 (ref 5–15)
BUN: 11 mg/dL (ref 6–20)
CO2: 24 mmol/L (ref 22–32)
CREATININE: 0.87 mg/dL (ref 0.44–1.00)
Calcium: 8.7 mg/dL — ABNORMAL LOW (ref 8.9–10.3)
Chloride: 108 mmol/L (ref 98–111)
GFR calc Af Amer: >60 mL/min (ref >60)
GFR calc non Af Amer: >60 mL/min (ref >60)
GLUCOSE: 115 mg/dL — AB (ref 70–99)
Potassium: 4.2 mmol/L (ref 3.5–5.1)
SODIUM: 138 mmol/L (ref 135–145)

## 2018-08-08 LAB — ABO/RH: ABO/RH(D): A POS

## 2018-08-08 LAB — CBC
HCT: 41 % (ref 36.0–46.0)
Hemoglobin: 13.2 g/dL (ref 12.0–15.0)
MCH: 31.1 pg (ref 26.0–34.0)
MCHC: 32.2 g/dL (ref 30.0–36.0)
MCV: 96.5 fL (ref 80.0–100.0)
Platelets: 212 10*3/uL (ref 150–400)
RBC: 4.25 MIL/uL (ref 3.87–5.11)
RDW: 12.5 % (ref 11.5–15.5)
WBC: 6.6 10*3/uL (ref 4.0–10.5)
nRBC: 0 % (ref 0.0–0.2)

## 2018-08-08 NOTE — Progress Notes (Addendum)
Patient in today for preop appt for surgery on 08/09/2018.  Initial blood pressure reading in right arm was 165/103.  At end of preop appt blood pressure reading in right arm was 165/125.Marland Kitchen  Patient denies any chest pain or shortness of breath or dizziness.  Patient stated she did take am meds.  Patient then stated " I am probably upset because my daughter called and she has gotten her 5th speeding ticket since 10/2017.  Allowed patient to rest and blood pressure decreased to 162/93.

## 2018-08-08 NOTE — H&P (Signed)
Admission History and Physical Exam for a Gynecology Patient  Tammie Powell is a 47 y.o. female who presents for a vaginal hysterectomy and bilateral salpingectomy.  She had a cold knife conization of the cervix on June 28, 2018.  Her pathology report showed cervical intraepithelial neoplasia #3 with negative margins. The patient's Pap smear of 2019 was negative for intraepithelial neoplasia and malignancy.  Her high risk HPV test and her high risk HPV 16, 18 were positive.  Colposcopy and biopsies were performed.  The biopsy of the ectocervix was negative.  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 She has been followed at the [facility].  Her endometrial biopsy is negative.  OB History   None         Past Medical History:  Diagnosis Date  . Hypertension   . Thyroid disease              Past Surgical History:  Procedure Laterality Date  . CHOLECYSTECTOMY    . RIGHT/LEFT HEART CATH AND CORONARY ANGIOGRAPHY N/A 10/17/2017   Procedure: RIGHT/LEFT HEART CATH AND CORONARY ANGIOGRAPHY;  Surgeon: Leonie Man, MD;  Location: [facility];  Service: Cardiovascular;:: Normal LVEDP.  Possibly 2+ MR with normal systolic function.  Normal right heart pressures. -->  Increased amlodipine to 10 mg daily  . TRANSTHORACIC ECHOCARDIOGRAM  09/2017   EF 60-65%.  No regional wall motion normalities.  No evidence of elevated PA pressures.  Normal valves.  Essentially normal echo  . TUBAL LIGATION    June 28, 2018     cold knife conization of the cervix       Allergies  Allergen Reactions  . Iodinated Diagnostic Agents Itching  . Codeine Hives, Itching and Other (See Comments)    burning  . Gadolinium Derivatives Hives and Other (See Comments)    Patient had a few hives that were mildly itchy. Checked by dr Carlis Abbott, since severity was so low, and patient had no driver, he recommended she  take benadryl at home. She remained in office for 40 minutes post injection to be observed.   . Tape Swelling and Rash   Medication:  Furosemide, Synthroid, Motrin.  Family History: family history includes Aneurysm in her maternal aunt; Arthritis in her maternal grandmother; CAD in her father; Heart attack (age of onset: 65) in her father; Hypertension in her maternal grandmother and mother; Hypothyroidism in her mother, sister, and sister; Seizures in her maternal aunt; Stroke in her mother.  Social History:  reports that she has never smoked. She has never used smokeless tobacco. She reports that she does not drink alcohol or use drugs.  Review of systems: See HPI.  Admission Physical Exam:    BMI equals 38.77  HEENT:                 Within normal limits Chest:                   Clear Heart:                    Regular rate and rhythm Breasts:                No masses, skin changes, bleeding, or discharge present Abdomen:             Nontender, no masses Extremities:          Grossly normal Neurologic exam: Grossly normal  Pelvic exam:  External genitalia: normal general appearance Vaginal: normal mucosa without prolapse or lesions Cervix: normal appearance Adnexa: normal bimanual exam Uterus: Upper limits normal size, shape, consistency  Assessment:  CIN-3 of the cervix  High risk HPV  Hypothyroidism  Hypertension  Obesity  Plan:  The patient will undergo a vaginal hysterectomy and bilateral salpingectomy.  She understands the indications for surgical procedure.    We discussed alternative treatment options which include observation with repeat Pap smears.  She wants to proceed with definitive therapy.  She accepts the risk of, but not limited to, anesthetic complications, bleeding, infections, and possible damage to the surrounding organs.              Eli Hose, M. D. August 08, 2018

## 2018-08-08 NOTE — Progress Notes (Signed)
Cath- 09/2017-epic

## 2018-08-09 ENCOUNTER — Observation Stay (HOSPITAL_BASED_OUTPATIENT_CLINIC_OR_DEPARTMENT_OTHER)
Admission: RE | Admit: 2018-08-09 | Discharge: 2018-08-10 | Disposition: A | Discharge status: Home / Self Care | Payer: BLUE CROSS/BLUE SHIELD | Attending: Obstetrics and Gynecology | Admitting: Obstetrics and Gynecology

## 2018-08-09 ENCOUNTER — Ambulatory Visit (HOSPITAL_BASED_OUTPATIENT_CLINIC_OR_DEPARTMENT_OTHER): Payer: BLUE CROSS/BLUE SHIELD | Admitting: Anesthesiology

## 2018-08-09 ENCOUNTER — Encounter (HOSPITAL_BASED_OUTPATIENT_CLINIC_OR_DEPARTMENT_OTHER): Payer: Self-pay | Admitting: Anesthesiology

## 2018-08-09 ENCOUNTER — Encounter (HOSPITAL_COMMUNITY)
Admission: RE | Disposition: A | Discharge status: Home / Self Care | Payer: Self-pay | Source: Home / Self Care | Attending: Obstetrics and Gynecology

## 2018-08-09 ENCOUNTER — Other Ambulatory Visit: Payer: Self-pay

## 2018-08-09 DIAGNOSIS — Z6839 Body mass index (BMI) 39.0-39.9, adult: Secondary | ICD-10-CM | POA: Insufficient documentation

## 2018-08-09 DIAGNOSIS — R87810 Cervical high risk human papillomavirus (HPV) DNA test positive: Secondary | ICD-10-CM | POA: Diagnosis not present

## 2018-08-09 DIAGNOSIS — D069 Carcinoma in situ of cervix, unspecified: Principal | ICD-10-CM | POA: Diagnosis present

## 2018-08-09 DIAGNOSIS — I10 Essential (primary) hypertension: Secondary | ICD-10-CM | POA: Insufficient documentation

## 2018-08-09 DIAGNOSIS — Z791 Long term (current) use of non-steroidal anti-inflammatories (NSAID): Secondary | ICD-10-CM | POA: Diagnosis not present

## 2018-08-09 DIAGNOSIS — N736 Female pelvic peritoneal adhesions (postinfective): Secondary | ICD-10-CM | POA: Insufficient documentation

## 2018-08-09 DIAGNOSIS — E039 Hypothyroidism, unspecified: Secondary | ICD-10-CM | POA: Diagnosis not present

## 2018-08-09 DIAGNOSIS — N7011 Chronic salpingitis: Secondary | ICD-10-CM | POA: Insufficient documentation

## 2018-08-09 DIAGNOSIS — Z79899 Other long term (current) drug therapy: Secondary | ICD-10-CM | POA: Insufficient documentation

## 2018-08-09 DIAGNOSIS — D259 Leiomyoma of uterus, unspecified: Secondary | ICD-10-CM | POA: Diagnosis present

## 2018-08-09 DIAGNOSIS — Z79890 Hormone replacement therapy (postmenopausal): Secondary | ICD-10-CM | POA: Insufficient documentation

## 2018-08-09 HISTORY — PX: VAGINAL HYSTERECTOMY: SHX2639

## 2018-08-09 LAB — POCT HEMOGLOBIN-HEMACUE: Hemoglobin: 12.7 g/dL (ref 12.0–15.0)

## 2018-08-09 LAB — TYPE AND SCREEN
ABO/RH(D): A POS
Antibody Screen: NEGATIVE

## 2018-08-09 SURGERY — HYSTERECTOMY, VAGINAL
Anesthesia: General | Laterality: Right

## 2018-08-09 MED ORDER — ONDANSETRON HCL 4 MG/2ML IJ SOLN
4.0000 mg | Freq: Four times a day (QID) | INTRAMUSCULAR | Status: DC | PRN
  Filled 2018-08-09: qty 2

## 2018-08-09 MED ORDER — DOCUSATE SODIUM 100 MG PO CAPS
100.0000 mg | ORAL_CAPSULE | Freq: Two times a day (BID) | ORAL | Status: DC
  Administered 2018-08-09 (×2): 100 mg via ORAL
  Filled 2018-08-09 (×3): qty 1

## 2018-08-09 MED ORDER — HYDROMORPHONE HCL 1 MG/ML IJ SOLN
INTRAMUSCULAR | Status: AC
  Filled 2018-08-09: qty 1

## 2018-08-09 MED ORDER — PROPOFOL 10 MG/ML IV BOLUS
INTRAVENOUS | Status: DC | PRN
  Administered 2018-08-09: 160 mg via INTRAVENOUS

## 2018-08-09 MED ORDER — ONDANSETRON HCL 4 MG PO TABS
4.0000 mg | ORAL_TABLET | Freq: Four times a day (QID) | ORAL | Status: DC | PRN
  Filled 2018-08-09: qty 1

## 2018-08-09 MED ORDER — FENTANYL CITRATE (PF) 100 MCG/2ML IJ SOLN
INTRAMUSCULAR | Status: AC
  Filled 2018-08-09: qty 2

## 2018-08-09 MED ORDER — PROMETHAZINE HCL 25 MG/ML IJ SOLN
6.2500 mg | INTRAMUSCULAR | Status: DC | PRN
  Filled 2018-08-09: qty 1

## 2018-08-09 MED ORDER — LEVOTHYROXINE SODIUM 50 MCG PO TABS
175.0000 ug | ORAL_TABLET | Freq: Every day | ORAL | Status: DC
  Filled 2018-08-09: qty 1

## 2018-08-09 MED ORDER — SCOPOLAMINE 1 MG/3DAYS TD PT72
MEDICATED_PATCH | TRANSDERMAL | Status: AC
  Filled 2018-08-09: qty 1

## 2018-08-09 MED ORDER — KETOROLAC TROMETHAMINE 30 MG/ML IJ SOLN
30.0000 mg | Freq: Four times a day (QID) | INTRAMUSCULAR | Status: DC
  Administered 2018-08-09 – 2018-08-10 (×3): 30 mg via INTRAVENOUS
  Filled 2018-08-09 (×4): qty 1

## 2018-08-09 MED ORDER — LACTATED RINGERS IV SOLN
INTRAVENOUS | Status: DC
  Filled 2018-08-09: qty 1000

## 2018-08-09 MED ORDER — LACTATED RINGERS IV SOLN
INTRAVENOUS | Status: DC
  Administered 2018-08-09: 17:00:00 via INTRAVENOUS
  Filled 2018-08-09: qty 1000

## 2018-08-09 MED ORDER — SCOPOLAMINE 1 MG/3DAYS TD PT72
MEDICATED_PATCH | TRANSDERMAL | Status: DC | PRN
  Administered 2018-08-09: 1 via TRANSDERMAL

## 2018-08-09 MED ORDER — LIDOCAINE 2% (20 MG/ML) 5 ML SYRINGE
INTRAMUSCULAR | Status: DC | PRN
  Administered 2018-08-09: 1.5 mg/kg/h via INTRAVENOUS

## 2018-08-09 MED ORDER — CEFAZOLIN SODIUM-DEXTROSE 2-3 GM-%(50ML) IV SOLR
INTRAVENOUS | Status: DC | PRN
  Administered 2018-08-09: 2 g via INTRAVENOUS

## 2018-08-09 MED ORDER — DEXAMETHASONE SODIUM PHOSPHATE 10 MG/ML IJ SOLN
INTRAMUSCULAR | Status: DC | PRN
  Administered 2018-08-09: 5 mg via INTRAVENOUS

## 2018-08-09 MED ORDER — BUPIVACAINE LIPOSOME 1.3 % IJ SUSP
INTRAMUSCULAR | Status: DC | PRN
  Administered 2018-08-09: 50 mL

## 2018-08-09 MED ORDER — DILTIAZEM HCL ER COATED BEADS 180 MG PO CP24
180.0000 mg | ORAL_CAPSULE | Freq: Every day | ORAL | Status: DC
  Administered 2018-08-09: 180 mg via ORAL
  Filled 2018-08-09 (×2): qty 1

## 2018-08-09 MED ORDER — CELECOXIB 400 MG PO CAPS
400.0000 mg | ORAL_CAPSULE | ORAL | Status: AC
  Administered 2018-08-09: 400 mg via ORAL
  Filled 2018-08-09: qty 1

## 2018-08-09 MED ORDER — HYDROMORPHONE HCL 2 MG PO TABS
2.0000 mg | ORAL_TABLET | Freq: Four times a day (QID) | ORAL | Status: DC | PRN
  Administered 2018-08-09 – 2018-08-10 (×2): 2 mg via ORAL
  Filled 2018-08-09 (×3): qty 1

## 2018-08-09 MED ORDER — CELECOXIB 200 MG PO CAPS
ORAL_CAPSULE | ORAL | Status: AC
  Filled 2018-08-09: qty 2

## 2018-08-09 MED ORDER — ONDANSETRON HCL 4 MG/2ML IJ SOLN
INTRAMUSCULAR | Status: AC
  Filled 2018-08-09: qty 2

## 2018-08-09 MED ORDER — HYDROMORPHONE HCL 1 MG/ML IJ SOLN
0.2500 mg | INTRAMUSCULAR | Status: DC | PRN
  Administered 2018-08-09: 0.25 mg via INTRAVENOUS
  Administered 2018-08-09: 0.5 mg via INTRAVENOUS
  Administered 2018-08-09: 0.25 mg via INTRAVENOUS
  Filled 2018-08-09: qty 0.5

## 2018-08-09 MED ORDER — SUGAMMADEX SODIUM 200 MG/2ML IV SOLN
INTRAVENOUS | Status: AC
  Filled 2018-08-09: qty 2

## 2018-08-09 MED ORDER — PROPOFOL 10 MG/ML IV BOLUS
INTRAVENOUS | Status: AC
  Filled 2018-08-09: qty 40

## 2018-08-09 MED ORDER — MIDAZOLAM HCL 2 MG/2ML IJ SOLN
INTRAMUSCULAR | Status: AC
  Filled 2018-08-09: qty 2

## 2018-08-09 MED ORDER — MIDAZOLAM HCL 2 MG/2ML IJ SOLN
INTRAMUSCULAR | Status: DC | PRN
  Administered 2018-08-09: 2 mg via INTRAVENOUS

## 2018-08-09 MED ORDER — GABAPENTIN 300 MG PO CAPS
300.0000 mg | ORAL_CAPSULE | ORAL | Status: AC
  Administered 2018-08-09: 300 mg via ORAL
  Filled 2018-08-09: qty 1

## 2018-08-09 MED ORDER — KETOROLAC TROMETHAMINE 15 MG/ML IJ SOLN
15.0000 mg | INTRAMUSCULAR | Status: AC
  Administered 2018-08-09: 30 mg via INTRAVENOUS
  Filled 2018-08-09: qty 1

## 2018-08-09 MED ORDER — NITROGLYCERIN 0.4 MG SL SUBL
0.4000 mg | SUBLINGUAL_TABLET | SUBLINGUAL | Status: DC | PRN
  Filled 2018-08-09: qty 25

## 2018-08-09 MED ORDER — ISOSORBIDE MONONITRATE ER 30 MG PO TB24
30.0000 mg | ORAL_TABLET | Freq: Every day | ORAL | Status: DC
  Filled 2018-08-09: qty 1

## 2018-08-09 MED ORDER — KETOROLAC TROMETHAMINE 30 MG/ML IJ SOLN
INTRAMUSCULAR | Status: AC
  Filled 2018-08-09: qty 1

## 2018-08-09 MED ORDER — ONDANSETRON HCL 4 MG/2ML IJ SOLN
INTRAMUSCULAR | Status: DC | PRN
  Administered 2018-08-09: 4 mg via INTRAVENOUS

## 2018-08-09 MED ORDER — KETAMINE HCL 10 MG/ML IJ SOLN
INTRAMUSCULAR | Status: DC | PRN
  Administered 2018-08-09: 15 mg via INTRAVENOUS
  Administered 2018-08-09: 25 mg via INTRAVENOUS

## 2018-08-09 MED ORDER — SUGAMMADEX SODIUM 200 MG/2ML IV SOLN
INTRAVENOUS | Status: DC | PRN
  Administered 2018-08-09: 300 mg via INTRAVENOUS

## 2018-08-09 MED ORDER — ARTIFICIAL TEARS OPHTHALMIC OINT
TOPICAL_OINTMENT | OPHTHALMIC | Status: AC
  Filled 2018-08-09: qty 3.5

## 2018-08-09 MED ORDER — HYDROMORPHONE HCL 1 MG/ML IJ SOLN
1.0000 mg | INTRAMUSCULAR | Status: DC | PRN
  Filled 2018-08-09: qty 1

## 2018-08-09 MED ORDER — AMLODIPINE BESYLATE 10 MG PO TABS
10.0000 mg | ORAL_TABLET | Freq: Every day | ORAL | Status: DC
  Administered 2018-08-09: 10 mg via ORAL
  Filled 2018-08-09 (×2): qty 1

## 2018-08-09 MED ORDER — ROCURONIUM BROMIDE 100 MG/10ML IV SOLN
INTRAVENOUS | Status: DC | PRN
  Administered 2018-08-09: 15 mg via INTRAVENOUS
  Administered 2018-08-09: 60 mg via INTRAVENOUS

## 2018-08-09 MED ORDER — GABAPENTIN 300 MG PO CAPS
ORAL_CAPSULE | ORAL | Status: AC
  Filled 2018-08-09: qty 1

## 2018-08-09 MED ORDER — METOPROLOL TARTRATE 50 MG PO TABS
50.0000 mg | ORAL_TABLET | Freq: Two times a day (BID) | ORAL | Status: DC
  Filled 2018-08-09: qty 1

## 2018-08-09 MED ORDER — FENTANYL CITRATE (PF) 100 MCG/2ML IJ SOLN
INTRAMUSCULAR | Status: DC | PRN
  Administered 2018-08-09 (×3): 50 ug via INTRAVENOUS

## 2018-08-09 MED ORDER — KETAMINE HCL 10 MG/ML IJ SOLN
INTRAMUSCULAR | Status: AC
  Filled 2018-08-09: qty 1

## 2018-08-09 MED ORDER — LIDOCAINE 2% (20 MG/ML) 5 ML SYRINGE: INTRAMUSCULAR | Status: DC | PRN

## 2018-08-09 MED ORDER — MENTHOL 3 MG MT LOZG
1.0000 | LOZENGE | OROMUCOSAL | Status: DC | PRN
  Filled 2018-08-09: qty 9

## 2018-08-09 MED ORDER — ACETAMINOPHEN 500 MG PO TABS
1000.0000 mg | ORAL_TABLET | ORAL | Status: AC
  Administered 2018-08-09: 1000 mg via ORAL
  Filled 2018-08-09: qty 2

## 2018-08-09 MED ORDER — SIMETHICONE 80 MG PO CHEW
80.0000 mg | CHEWABLE_TABLET | Freq: Four times a day (QID) | ORAL | Status: DC | PRN
  Filled 2018-08-09: qty 1

## 2018-08-09 MED ORDER — SUGAMMADEX SODIUM 200 MG/2ML IV SOLN
INTRAVENOUS | Status: AC
  Filled 2018-08-09: qty 6

## 2018-08-09 MED ORDER — LACTATED RINGERS IV SOLN
INTRAVENOUS | Status: DC | PRN
  Administered 2018-08-09 (×2): via INTRAVENOUS

## 2018-08-09 MED ORDER — LIDOCAINE 2% (20 MG/ML) 5 ML SYRINGE
INTRAMUSCULAR | Status: AC
  Filled 2018-08-09: qty 5

## 2018-08-09 MED ORDER — DEXAMETHASONE SODIUM PHOSPHATE 10 MG/ML IJ SOLN
INTRAMUSCULAR | Status: AC
  Filled 2018-08-09: qty 1

## 2018-08-09 MED ORDER — ACETAMINOPHEN 500 MG PO TABS
ORAL_TABLET | ORAL | Status: AC
  Filled 2018-08-09: qty 2

## 2018-08-09 MED ORDER — METOPROLOL TARTRATE 50 MG PO TABS: 50.0000 mg | ORAL_TABLET | Freq: Two times a day (BID) | ORAL | Status: DC

## 2018-08-09 MED ORDER — VASOPRESSIN 20 UNIT/ML IV SOLN
INTRAVENOUS | Status: DC | PRN
  Administered 2018-08-09: 20 [IU]

## 2018-08-09 SURGICAL SUPPLY — 28 items
CANISTER SUCT 3000ML PPV (MISCELLANEOUS) ×3 IMPLANT
DECANTER SPIKE VIAL GLASS SM (MISCELLANEOUS) IMPLANT
DRAPE SHEET LG 3/4 BI-LAMINATE (DRAPES) ×6 IMPLANT
GLOVE BIOGEL PI IND STRL 6.5 (GLOVE) ×1 IMPLANT
GLOVE BIOGEL PI IND STRL 7.0 (GLOVE) ×1 IMPLANT
GLOVE BIOGEL PI IND STRL 8.5 (GLOVE) ×1 IMPLANT
GLOVE BIOGEL PI INDICATOR 6.5 (GLOVE) ×2
GLOVE BIOGEL PI INDICATOR 7.0 (GLOVE) ×2
GLOVE BIOGEL PI INDICATOR 8.5 (GLOVE) ×2
GLOVE ECLIPSE 8.0 STRL XLNG CF (GLOVE) ×6 IMPLANT
GOWN STRL REUS W/TWL LRG LVL3 (GOWN DISPOSABLE) ×12 IMPLANT
NDL MAYO CATGUT SZ4 TPR NDL (NEEDLE) ×1 IMPLANT
NDL SPNL 22GX3.5 QUINCKE BK (NEEDLE) IMPLANT
NEEDLE MAYO CATGUT SZ4 (NEEDLE) ×3 IMPLANT
NEEDLE SPNL 22GX3.5 QUINCKE BK (NEEDLE) IMPLANT
NS IRRIG 1000ML POUR BTL (IV SOLUTION) ×3 IMPLANT
PACK VAGINAL WOMENS (CUSTOM PROCEDURE TRAY) ×3 IMPLANT
PAD OB MATERNITY 4.3X12.25 (PERSONAL CARE ITEMS) ×3 IMPLANT
PENCIL SMOKE EVAC W/HOLSTER (ELECTROSURGICAL) ×3 IMPLANT
SUT CHROMIC 2 0 TIES 18 (SUTURE) IMPLANT
SUT VIC AB 0 CT1 18XCR BRD8 (SUTURE) ×3 IMPLANT
SUT VIC AB 0 CT1 27 (SUTURE) ×3
SUT VIC AB 0 CT1 27XBRD ANBCTR (SUTURE) ×1 IMPLANT
SUT VIC AB 0 CT1 8-18 (SUTURE) ×9
SUT VICRYL 0 TIES 12 18 (SUTURE) ×3 IMPLANT
SYR CONTROL 10ML LL (SYRINGE) IMPLANT
TOWEL OR 17X24 6PK STRL BLUE (TOWEL DISPOSABLE) ×6 IMPLANT
TRAY FOLEY W/BAG SLVR 14FR (SET/KITS/TRAYS/PACK) ×3 IMPLANT

## 2018-08-09 NOTE — Op Note (Signed)
OPERATIVE NOTE  Tammie Powell  DOB:    01-09-1971  MRN:    [id number]  CSN:    [id number]  Date of Surgery:  08/09/2018  Preoperative Diagnosis:  Cervical intraepithelial neoplasia III BMI equals 39.52  Postoperative Diagnosis:  Cervical intraepithelial neoplasia III BMI equals 39.52 Adhesions between the left fallopian tube and the left broad ligament  Procedure:  Vaginal hysterectomy  Right salpingectomy  Surgeon:  Gildardo Cranker, M.D.  Assistant:  Earnstine Regal, PA-C  Anesthetic:  General  Disposition:  The patient presents with the above-mentioned diagnosis. She understands the indications for surgical procedure.  She also understands the alternative treatment options. She accepts the risk of, but not limited to, anesthetic complications, bleeding, infections, and possible damage to the surrounding organs.  Findings:  The patient had a 173 g uterus.  There were adhesions between the left fallopian tube and the left broad ligament.  The left ovary appeared normal.  The right fallopian tube and the right ovary appeared normal.  There was no parametrial thickening.  No adenopathy was appreciated.  The posterior cul-de-sac and the anterior cul-de-sacs were clear of adhesions.  Procedure:  The patient was taken to the operating room where a general anesthetic was given. The patient's lower abdomen, perineum, and vagina were prepped with multiple layers of chlorhexidine with alcohol. A Foley catheter was placed in the bladder. The patient was sterilely draped. An examination under anesthesia was performed. Operative findings are mentioned above. The cervix was injected with half percent Marcaine with epinephrine. A circumferential incision was made around the cervix. The vaginal mucosa was advanced anteriorly and posteriorly. The anterior cul-de-sac and in the posterior cul-de-sac were sharply entered. Alternating from right to left the uterosacral ligaments,  paracervical tissues, parametrial tissues, and uterine arteries were clamped, cut, sutured, and tied securely. The upper pedicles were then clamped and cut. The uterus was removed from the operative field. The upper pedicles were secured using free ties and then suture ligatures. The left fallopian tube was adhered to the left broad ligament.  We were unable to isolate the left fallopian tube for safe removal.  The right fallopian tube was easily isolated however.  The right mesosalpinx was clamped and cut.  The mesosalpinx was then secured using 2 free ties.  Hemostasis was adequate.  Exparel was injected along the suture lines.  The sutures attached to the uterosacral ligaments were brought out through the vaginal angles and then tied securely. A McCall culdoplasty suture was placed in the posterior cul-de-sac incorporating the uterosacral ligaments bilaterally and the posterior peritoneum. A final check was made for hemostasis and again hemostasis was confirmed. The vaginal cuff was closed using figure-of-eight sutures incorporating the anterior vaginal mucosa, the anterior peritoneum, posterior peritoneum, and the posterior vaginal mucosa. The McCall culdoplasty suture was tied securely and the apex of the vagina was noted to elevate into the midpelvis.  Exparel was then injected into the vaginal cuff.  The patient tolerated her procedure well. She was awakened from her anesthetic without difficulty and then transported to the recovery room in stable condition. Sponge, needle, and instrument counts were correct on 2 occasions. The estimated blood loss was 200 cc's. 0 Vicryl is the suture material used throughout the procedure. The uterus and the right fallopian tube were sent to pathology.  Gildardo Cranker, M.D.  08/09/2018

## 2018-08-09 NOTE — Transfer of Care (Signed)
Immediate Anesthesia Transfer of Care Note  Patient: Tammie Powell  Procedure(s) Performed: HYSTERECTOMY VAGINAL WITH RIGHT SALPINGECTOMY (Right )  Patient Location: PACU  Anesthesia Type:General  Level of Consciousness: awake, alert , oriented and patient cooperative  Airway & Oxygen Therapy: Patient Spontanous Breathing and Patient connected to nasal cannula oxygen  Post-op Assessment: Report given to RN and Post -op Vital signs reviewed and stable  Post vital signs: Reviewed and stable  Last Vitals:  Vitals Value Taken Time  BP    Temp    Pulse    Resp 17 08/09/2018 11:20 AM  SpO2    Vitals shown include unvalidated device data.  Last Pain:  Vitals:   08/09/18 0841  TempSrc:   PainSc: 0-No pain      Patients Stated Pain Goal: 6 (67/20/91 9802)  Complications: No apparent anesthesia complications

## 2018-08-09 NOTE — Progress Notes (Signed)
Tammie Powell is a33 y.o.  [id number]  Day of Surgery:  TVH/RS  Subjective:  Patient is Doing well postoperatively. Patient has Pain is controlled with current analgesics. Medications being used: ERAS Protocol with surgery; has not requested any pain medication since procedure.  Rates pain at 4/10 on a 10 point pain scale.. Ambulating in the halls without dizziness, tolerating liquids without nausea and Foley draining clear yellow urine (approximately 300 cc in Foley bag).   Objective: Vital signs in last 24 hours: Temp:  [97.9 F (36.6 C)-99 F (37.2 C)] 99 F (37.2 C) (12/13 1432) Pulse Rate:  [58-69] 69 (12/13 1432) Resp:  [12-18] 16 (12/13 1432) BP: (131-153)/(73-98) 151/75 (12/13 1432) SpO2:  [95 %-100 %] 99 % (12/13 1432) Weight:  [107.7 kg] 107.7 kg (12/13 0706)  Intake/Output from previous day: No intake/output data recorded. Intake/Output this shift: Total I/O In: 2050 [I.V.:2000; IV Piggyback:50] Out: 625 [Urine:425; Blood:200] Recent Labs  Lab 08/08/18 1115 08/09/18 0801  WBC 6.6  --   HGB 13.2 12.7  HCT 41.0  --   PLT 212  --      Recent Labs  Lab 08/08/18 1115  NA 138  K 4.2  CL 108  CO2 24  BUN 11  CREATININE 0.87  CALCIUM 8.7*  GLUCOSE 115*    EXAM: General: alert, cooperative and no distress Resp: clear to auscultation bilaterally Cardio: regular rate and rhythm, S1, S2 normal, no murmur, click, rub or gallop GI: bowel sounds present in all 4 quadrants. Extremities: Homans sign is negative, no sign of DVT and no calf tenderness; SCD hose in place and functioning.   Assessment: s/p Procedure(s): HYSTERECTOMY VAGINAL WITH RIGHT SALPINGECTOMY: stable and progressing well  Plan: Routine care  Advance diet as tolerated Ambulate in the halls D/C Foley in a.m.  LOS: 0 days    Earnstine Regal, PA-C 08/09/2018 4:37 PM

## 2018-08-09 NOTE — Anesthesia Postprocedure Evaluation (Signed)
Anesthesia Post Note  Patient: Infant A Bayne  Procedure(s) Performed: HYSTERECTOMY VAGINAL WITH RIGHT SALPINGECTOMY (Right )     Patient location during evaluation: PACU Anesthesia Type: General Level of consciousness: awake and alert Pain management: pain level controlled Vital Signs Assessment: post-procedure vital signs reviewed and stable Respiratory status: spontaneous breathing, nonlabored ventilation, respiratory function stable and patient connected to nasal cannula oxygen Cardiovascular status: blood pressure returned to baseline and stable Postop Assessment: no apparent nausea or vomiting Anesthetic complications: no    Last Vitals:  Vitals:   08/09/18 0706 08/09/18 1118  BP: (!) 153/98 132/73  Pulse: 67 68  Resp: 16 16  Temp: 36.7 C 36.9 C  SpO2: 99% 100%    Last Pain:  Vitals:   08/09/18 1156  TempSrc:   PainSc: 4                  Ahlani Wickes S

## 2018-08-09 NOTE — Anesthesia Procedure Notes (Signed)
Procedure Name: Intubation Date/Time: 08/09/2018 9:24 AM Performed by: Wanita Chamberlain, CRNA Pre-anesthesia Checklist: Timeout performed, Patient being monitored, Suction available, Emergency Drugs available and Patient identified Patient Re-evaluated:Patient Re-evaluated prior to induction Oxygen Delivery Method: Circle system utilized Preoxygenation: Pre-oxygenation with 100% oxygen Induction Type: IV induction Ventilation: Mask ventilation without difficulty Grade View: Grade I Tube type: Oral Tube size: 7.0 mm Number of attempts: 1 Airway Equipment and Method: Stylet Placement Confirmation: breath sounds checked- equal and bilateral,  CO2 detector,  positive ETCO2 and ETT inserted through vocal cords under direct vision Secured at: 21 cm Tube secured with: Tape Dental Injury: Teeth and Oropharynx as per pre-operative assessment

## 2018-08-09 NOTE — Progress Notes (Signed)
The patient was interviewed and examined today.  The previously documented history and physical examination was reviewed. There are no changes. The operative procedure was reviewed. The risks and benefits were outlined again. The specific risks include, but are not limited to, anesthetic complications, bleeding, infections, and possible damage to the surrounding organs. The patient's questions were answered.  We are ready to proceed as outlined. The likelihood of the patient achieving the goals of this procedure is very likely.   BP (!) 153/98   Pulse 67   Temp 98 F (36.7 C) (Oral)   Resp 16   Ht 5\' 5"  (1.651 m)   Wt 107.7 kg   LMP 08/08/2018   SpO2 99%   BMI 39.52 kg/m  CBC    Component Value Date/Time   WBC 6.6 08/08/2018 1115   RBC 4.25 08/08/2018 1115   HGB 12.7 08/09/2018 0801   HGB 12.7 10/12/2017 1419   HCT 41.0 08/08/2018 1115   HCT 37.6 10/12/2017 1419   PLT 212 08/08/2018 1115   PLT 238 10/12/2017 1419   MCV 96.5 08/08/2018 1115   MCV 92 10/12/2017 1419   MCH 31.1 08/08/2018 1115   MCHC 32.2 08/08/2018 1115   RDW 12.5 08/08/2018 1115   RDW 13.3 10/12/2017 1419   LYMPHSABS 1.9 12/03/2015 2142   MONOABS 0.7 12/03/2015 2142   EOSABS 0.3 12/03/2015 2142   BASOSABS 0.0 12/03/2015 2142     Gildardo Cranker, M.D.

## 2018-08-09 NOTE — Anesthesia Preprocedure Evaluation (Addendum)
Anesthesia Evaluation  Patient identified by MRN, date of birth, ID band Patient awake    Reviewed: Allergy & Precautions, NPO status , Patient's Chart, lab work & pertinent test results  History of Anesthesia Complications (+) PONV  Airway Mallampati: II  TM Distance: >3 FB Neck ROM: Full    Dental no notable dental hx. (+) Dental Advisory Given, Upper Dentures, Lower Dentures   Pulmonary neg pulmonary ROS,    Pulmonary exam normal breath sounds clear to auscultation       Cardiovascular hypertension, Pt. on medications and Pt. on home beta blockers Normal cardiovascular exam Rhythm:Regular Rate:Normal     Neuro/Psych negative neurological ROS  negative psych ROS   GI/Hepatic Neg liver ROS, GERD  ,  Endo/Other  Hypothyroidism Morbid obesity  Renal/GU negative Renal ROS  negative genitourinary   Musculoskeletal negative musculoskeletal ROS (+)   Abdominal   Peds negative pediatric ROS (+)  Hematology negative hematology ROS (+)   Anesthesia Other Findings   Reproductive/Obstetrics negative OB ROS                           Anesthesia Physical Anesthesia Plan  ASA: III  Anesthesia Plan: General   Post-op Pain Management:    Induction: Intravenous  PONV Risk Score and Plan: 4 or greater and Ondansetron, Dexamethasone, TIVA, Midazolam, Scopolamine patch - Pre-op and Treatment may vary due to age or medical condition  Airway Management Planned: Oral ETT  Additional Equipment:   Intra-op Plan:   Post-operative Plan: Extubation in OR  Informed Consent: I have reviewed the patients History and Physical, chart, labs and discussed the procedure including the risks, benefits and alternatives for the proposed anesthesia with the patient or authorized representative who has indicated his/her understanding and acceptance.   Dental advisory given  Plan Discussed with: CRNA and  Surgeon  Anesthesia Plan Comments:        Anesthesia Quick Evaluation

## 2018-08-10 DIAGNOSIS — D069 Carcinoma in situ of cervix, unspecified: Secondary | ICD-10-CM | POA: Diagnosis not present

## 2018-08-10 LAB — CBC
HEMATOCRIT: 34.9 % — AB (ref 36.0–46.0)
Hemoglobin: 11.3 g/dL — ABNORMAL LOW (ref 12.0–15.0)
MCH: 31 pg (ref 26.0–34.0)
MCHC: 32.4 g/dL (ref 30.0–36.0)
MCV: 95.9 fL (ref 80.0–100.0)
Platelets: 213 10*3/uL (ref 150–400)
RBC: 3.64 MIL/uL — ABNORMAL LOW (ref 3.87–5.11)
RDW: 12.1 % (ref 11.5–15.5)
WBC: 13.7 10*3/uL — ABNORMAL HIGH (ref 4.0–10.5)
nRBC: 0 % (ref 0.0–0.2)

## 2018-08-10 MED ORDER — ONDANSETRON HCL 4 MG PO TABS: 4.0000 mg | ORAL_TABLET | Freq: Three times a day (TID) | ORAL | 0 refills | Status: DC | PRN

## 2018-08-10 MED ORDER — HYDROMORPHONE HCL 2 MG PO TABS: 2.0000 mg | ORAL_TABLET | ORAL | 0 refills | Status: DC | PRN

## 2018-08-10 NOTE — Discharge Summary (Signed)
Physician Discharge Summary  Patient ID: SYNDI PUA MRN: [id number] DOB/AGE: Feb 19, 1971 47 y.o.  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 She has a history of  Cervical Intraepithelial Neoplasia Grade III, Pelvic Pain. Please see her documented history and physical exam.  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  She has decided to proceed with definitive therapy.  Her endometrial biopsy is negative.  Hospital course:  On the day of admission, the patient underwent the following Procedure(s): HYSTERECTOMY VAGINAL WITH RIGHT SALPINGECTOMY. Operative findings included a 173 g normal size uterus.  No parametrial disease was appreciated.  The right fallopian tube and the right ovary were normal.  The left ovary was normal.  The left fallopian tube had adhesions between the left tube and the left broad ligament.  We did not remove the left fallopian tube because of the scar tissue. Her postoperative course was uneventful. She quickly tolerated a regular diet. Her postoperative pain was controlled with oral medication. She remained afebrile. Today she was felt to be ready for discharge.  Labs:  Hemoglobin  Date Value Ref Range Status  08/10/2018 11.3 (L) 12.0 - 15.0 g/dL Final  10/12/2017 12.7 11.1 - 15.9 g/dL Final   HCT   Date Value Ref Range Status  08/10/2018 34.9 (L) 36.0 - 46.0 % Final   Hematocrit  Date Value Ref Range Status  10/12/2017 37.6 34.0 - 46.6 % Final   BP 135/76 (BP Location: Right Arm)   Pulse 64   Temp 98.6 F (37 C)   Resp 16   Ht 5\' 5"  (1.651 m)   Wt 107.7 kg   LMP 08/08/2018   SpO2 98%   BMI 39.52 kg/m  CBC    Component Value Date/Time   WBC 13.7 (H) 08/10/2018 0545   RBC 3.64 (L) 08/10/2018 0545   HGB 11.3 (L) 08/10/2018 0545   HGB 12.7 10/12/2017 1419   HCT 34.9 (L) 08/10/2018 0545   HCT 37.6 10/12/2017 1419   PLT 213 08/10/2018 0545   PLT 238 10/12/2017 1419   MCV 95.9 08/10/2018 0545   MCV 92 10/12/2017 1419   MCH 31.0 08/10/2018 0545   MCHC 32.4 08/10/2018 0545   RDW 12.1 08/10/2018 0545   RDW 13.3 10/12/2017 1419   LYMPHSABS 1.9 12/03/2015 2142   MONOABS 0.7 12/03/2015 2142   EOSABS 0.3 12/03/2015 2142   BASOSABS 0.0 12/03/2015 2142    HEENT: No distress Heart: Regular rate and rhythm Chest: Clear Abdomen: Soft and appropriately tender.  Bowel sounds positive. Extremities: No edema or cyanosis or masses Vaginal pad: Spotting only Consults: None  Final pathology report: Pending at the time of discharge  Disposition:  The patient will be discharged to home. She has been given a copy of the discharge instructions as prepared by the [facility] for patients who have undergone the Procedure(s): HYSTERECTOMY VAGINAL WITH RIGHT SALPINGECTOMY. The patient  will refrain from driving for 1 week, heavy lifting for 3 to 4 weeks, and no intercourse for 6 weeks.  She will call for temperature greater than 100.4, or any other complication that requires my attention.    Allergies as of 08/10/2018      Reactions   Iodinated Diagnostic Agents Itching   Codeine Hives, Itching, Other (See Comments)   burning   Gadolinium Derivatives Hives, Other (See Comments)   Patient had a few hives that were mildly itchy. Checked by dr Carlis Abbott, since  severity was so low, and patient had no driver, he recommended she take benadryl at home. She remained in office for 40 minutes post injection to be observed.    Tape Swelling, Rash      Medication List    TAKE these medications   amLODipine 10 MG tablet Commonly known as:  NORVASC Take 1 tablet (10 mg total) by mouth daily.   aspirin EC 81 MG tablet Take 81 mg by mouth daily.   CARTIA XT 180 MG 24 hr capsule Generic drug:  diltiazem Take 180 mg by mouth at bedtime.   HYDROmorphone 2 MG tablet Commonly known as:  DILAUDID Take 1 tablet (2 mg total) by mouth every 4 (four) hours as needed for severe pain.   ibuprofen 800 MG tablet Commonly known as:  ADVIL,MOTRIN Take 800 mg by mouth 3 (three) times daily as needed for moderate pain or headache.   isosorbide mononitrate 30 MG 24 hr tablet Commonly known as:  IMDUR Take 1 tablet (30 mg total) by mouth daily.   levothyroxine 175 MCG tablet Commonly known as:  SYNTHROID, LEVOTHROID Take 175 mcg by mouth daily before breakfast.   metoprolol tartrate 50 MG tablet Commonly known as:  LOPRESSOR Take 50 mg by mouth 2 (two) times daily.   nitroGLYCERIN 0.4 MG SL tablet Commonly known as:  NITROSTAT Place 1 tablet (0.4 mg total) every 5 (five) minutes as needed under the tongue for chest pain.   ondansetron 4 MG tablet Commonly known as:  ZOFRAN Take 1 tablet (4 mg total) by mouth every 8 (eight) hours as needed for nausea or vomiting.   valACYclovir 500 MG tablet Commonly known as:  VALTREX Take 2,000 mg by mouth daily as needed (for fever blisters).      Follow-up Information    Ena Dawley, MD Follow up on 09/19/2018.   Specialty:  Obstetrics and Gynecology Why:  appointment time is 10:30 a.m. Contact information: [address] [place] [address] [phone]           Signed: Eli Hose 08/10/2018, 9:01 AM

## 2018-08-10 NOTE — Discharge Instructions (Signed)
Vaginal Hysterectomy, Care After Refer to this sheet in the next few weeks. These instructions provide you with information about caring for yourself after your procedure. Your health care provider may also give you more specific instructions. Your treatment has been planned according to current medical practices, but problems sometimes occur. Call your health care provider if you have any problems or questions after your procedure. What can I expect after the procedure? After the procedure, it is common to have:  Pain.  Soreness and numbness in your incision areas.  Vaginal bleeding and discharge.  Constipation.  Temporary problems emptying the bladder.  Feelings of sadness or other emotions.  Follow these instructions at home: Medicines  Take over-the-counter and prescription medicines only as told by your health care provider.  If you were prescribed an antibiotic medicine, take it as told by your health care provider. Do not stop taking the antibiotic even if you start to feel better.  Do not drive or operate heavy machinery while taking prescription pain medicine. Activity  Return to your normal activities as told by your health care provider. Ask your health care provider what activities are safe for you.  Get regular exercise as told by your health care provider. You may be told to take short walks every day and go farther each time.  Do not lift anything that is heavier than 10 lb (4.5 kg). General instructions   Do not put anything in your vagina for 6 weeks after your surgery or as told by your health care provider. This includes tampons and douches.  Do not have sex until your health care provider says you can.  Do not take baths, swim, or use a hot tub until your health care provider approves.  Drink enough fluid to keep your urine clear or pale yellow.  Do not drive for 24 hours if you were given a sedative.  Keep all follow-up visits as told by your health  care provider. This is important. Contact a health care provider if:  Your pain medicine is not helping.  You have a fever.  You have redness, swelling, or pain at your incision site.  You have blood, pus, or a bad-smelling discharge from your vagina.  You continue to have difficulty urinating. Get help right away if:  You have severe abdominal or back pain.  You have heavy bleeding from your vagina.  You have chest pain or shortness of breath. This information is not intended to replace advice given to you by your health care provider. Make sure you discuss any questions you have with your health care provider. Document Released: 12/06/2015 Document Revised: 01/20/2016 Document Reviewed: 08/29/2015 Elsevier Interactive Patient Education  2018 [facility] @ [phone] if:  You have a temperature greater than or equal to 100.4 degrees Farenheit orally You have pain that is not made better by the pain medication given and taken as directed You have excessive bleeding or problems urinating  Take Colace (Docusate Sodium/Stool Softener) 100 mg 2-3 times daily while taking narcotic pain medicine to avoid constipation or until bowel movements are regular. Take, with food, Ibuprofen 600 mg every 6 hours for 5 days then as needed for pain  You may drive after 2  weeks You may walk up steps  You may shower  You may resume a regular diet   Do not lift over 15 pounds for 6 weeks Avoid anything in vagina for 6 weeks (or until after your post-operative visit)

## 2018-08-12 ENCOUNTER — Encounter (HOSPITAL_BASED_OUTPATIENT_CLINIC_OR_DEPARTMENT_OTHER): Payer: Self-pay | Admitting: Obstetrics and Gynecology

## 2018-09-08 MED FILL — Bupivacaine Liposome Inj 1.3% (13.3 MG/ML): INTRAMUSCULAR | Qty: 20 | Status: AC

## 2019-03-11 ENCOUNTER — Emergency Department (HOSPITAL_COMMUNITY): Payer: BC Managed Care – PPO

## 2019-03-11 ENCOUNTER — Other Ambulatory Visit: Payer: Self-pay

## 2019-03-11 ENCOUNTER — Encounter (HOSPITAL_COMMUNITY): Payer: Self-pay

## 2019-03-11 ENCOUNTER — Emergency Department (HOSPITAL_COMMUNITY)
Admission: EM | Admit: 2019-03-11 | Discharge: 2019-03-11 | Disposition: A | Discharge status: Home / Self Care | Payer: BC Managed Care – PPO | Attending: Emergency Medicine | Admitting: Emergency Medicine

## 2019-03-11 DIAGNOSIS — E039 Hypothyroidism, unspecified: Secondary | ICD-10-CM | POA: Insufficient documentation

## 2019-03-11 DIAGNOSIS — Z79899 Other long term (current) drug therapy: Secondary | ICD-10-CM | POA: Diagnosis not present

## 2019-03-11 DIAGNOSIS — R0602 Shortness of breath: Secondary | ICD-10-CM | POA: Insufficient documentation

## 2019-03-11 DIAGNOSIS — R05 Cough: Secondary | ICD-10-CM | POA: Diagnosis not present

## 2019-03-11 DIAGNOSIS — Z91041 Radiographic dye allergy status: Secondary | ICD-10-CM | POA: Insufficient documentation

## 2019-03-11 DIAGNOSIS — Z888 Allergy status to other drugs, medicaments and biological substances status: Secondary | ICD-10-CM | POA: Insufficient documentation

## 2019-03-11 DIAGNOSIS — I10 Essential (primary) hypertension: Secondary | ICD-10-CM | POA: Insufficient documentation

## 2019-03-11 DIAGNOSIS — R0789 Other chest pain: Secondary | ICD-10-CM | POA: Insufficient documentation

## 2019-03-11 DIAGNOSIS — Z885 Allergy status to narcotic agent status: Secondary | ICD-10-CM | POA: Diagnosis not present

## 2019-03-11 LAB — COMPREHENSIVE METABOLIC PANEL
ALT: 25 U/L (ref 0–44)
AST: 16 U/L (ref 15–41)
Albumin: 4.1 g/dL (ref 3.5–5.0)
Alkaline Phosphatase: 90 U/L (ref 38–126)
Anion gap: 8 (ref 5–15)
BUN: 13 mg/dL (ref 6–20)
CO2: 24 mmol/L (ref 22–32)
Calcium: 9.2 mg/dL (ref 8.9–10.3)
Chloride: 105 mmol/L (ref 98–111)
Creatinine, Ser: 0.83 mg/dL (ref 0.44–1.00)
GFR calc Af Amer: >60 mL/min (ref >60)
GFR calc non Af Amer: >60 mL/min (ref >60)
Glucose, Bld: 97 mg/dL (ref 70–99)
Potassium: 3.8 mmol/L (ref 3.5–5.1)
Sodium: 137 mmol/L (ref 135–145)
Total Bilirubin: 0.4 mg/dL (ref 0.3–1.2)
Total Protein: 7.4 g/dL (ref 6.5–8.1)

## 2019-03-11 LAB — CBC WITH DIFFERENTIAL/PLATELET
Abs Immature Granulocytes: 0.05 10*3/uL (ref 0.00–0.07)
Basophils Absolute: 0 10*3/uL (ref 0.0–0.1)
Basophils Relative: 0 %
Eosinophils Absolute: 0.3 10*3/uL (ref 0.0–0.5)
Eosinophils Relative: 3 %
HCT: 38.7 % (ref 36.0–46.0)
Hemoglobin: 12.7 g/dL (ref 12.0–15.0)
Immature Granulocytes: 1 %
Lymphocytes Relative: 16 %
Lymphs Abs: 1.5 10*3/uL (ref 0.7–4.0)
MCH: 30.7 pg (ref 26.0–34.0)
MCHC: 32.8 g/dL (ref 30.0–36.0)
MCV: 93.5 fL (ref 80.0–100.0)
Monocytes Absolute: 0.7 10*3/uL (ref 0.1–1.0)
Monocytes Relative: 7 %
Neutro Abs: 7.2 10*3/uL (ref 1.7–7.7)
Neutrophils Relative %: 73 %
Platelets: 245 10*3/uL (ref 150–400)
RBC: 4.14 MIL/uL (ref 3.87–5.11)
RDW: 12.5 % (ref 11.5–15.5)
WBC: 9.7 10*3/uL (ref 4.0–10.5)
nRBC: 0 % (ref 0.0–0.2)

## 2019-03-11 LAB — I-STAT BETA HCG BLOOD, ED (MC, WL, AP ONLY): I-stat hCG, quantitative: <5 m[IU]/mL (ref <5)

## 2019-03-11 LAB — TROPONIN I (HIGH SENSITIVITY): Troponin I (High Sensitivity): <2 ng/L (ref <18)

## 2019-03-11 LAB — BRAIN NATRIURETIC PEPTIDE: B Natriuretic Peptide: 22 pg/mL (ref 0.0–100.0)

## 2019-03-11 LAB — D-DIMER, QUANTITATIVE: D-Dimer, Quant: <0.27 ug/mL-FEU (ref 0.00–0.50)

## 2019-03-11 MED ORDER — LIDOCAINE 5 % EX PTCH
1.0000 | MEDICATED_PATCH | CUTANEOUS | Status: DC
  Administered 2019-03-11: 20:00:00 1 via TRANSDERMAL
  Filled 2019-03-11: qty 1

## 2019-03-11 MED ORDER — LIDOCAINE 5 % EX PTCH: 1.0000 | MEDICATED_PATCH | CUTANEOUS | 0 refills | Status: DC

## 2019-03-11 NOTE — ED Provider Notes (Signed)
[facility] EMERGENCY DEPARTMENT Provider Note   CSN: [id number] Arrival date & time: 03/11/19  1823    History   Chief Complaint Chief Complaint  Patient presents with  . Abdominal Pain    HPI Tammie Powell is a 48 y.o. female with history of GERD, hypertension, hypothyroidism presents for evaluation of acute onset, progressively worsening shortness of breath and chest pain for 4 days.  She reports that she was diagnosed with COVID-19 infection in May.  She had cough, myalgias, and has had a few courses of prednisone and was started on an albuterol inhaler for her symptoms.  She reports that her cough and her other symptoms have been progressively improving but 4 days ago she noticed acute worsening of her right-sided chest pain.  She reports the pain is stabbing, worsens laying flat and with deep inspiration.  Improves sitting upward.  Denies leg swelling, abdominal pain, nausea, vomiting, fevers, or urinary symptoms.  She reports she has been using her albuterol inhaler more than usual over the last few days.  She is a non-smoker, denies recreational drug use or excessive alcohol intake.  She reached out to her PCP who recommended presentation to the ED for rule out of PE.  Denies recent travel or surgeries, no hemoptysis, no prior history of DVT or PE, and she is not on hormone replacement therapy.     The history is provided by the patient.    Past Medical History:  Diagnosis Date  . Complication of anesthesia   . GERD (gastroesophageal reflux disease)   . Hypertension   . Hypothyroidism   . PONV (postoperative nausea and vomiting)   . Thyroid disease     Patient Active Problem List   Diagnosis Date Noted  . Leiomyoma of uterus, unspecified 08/09/2018  . Cervical intraepithelial neoplasia grade III with severe dysplasia 08/09/2018  . 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 Progressive angina ([place]) 10/17/2017  . DOE (dyspnea on exertion) 09/19/2017  . Family history of  premature coronary artery disease 06/12/2017  . Chest pain with moderate risk for cardiac etiology - Persistent Sx despite Negative Ischemic Evaluation -- > and normal cath 06/12/2017  . Essential hypertension 06/12/2017  . Obesity (BMI 30.0-34.9) 06/12/2017  . Racing heart beat 06/12/2017    Past Surgical History:  Procedure Laterality Date  . CERVICAL CONE BIOPSY    . CERVICAL CONIZATION W/BX N/A 06/28/2018   Procedure: CONIZATION CERVIX WITH BIOPSY;  Surgeon: Ena Dawley, MD;  Location: [facility];  Service: Gynecology;  Laterality: N/A;  . CHOLECYSTECTOMY    . RIGHT/LEFT HEART CATH AND CORONARY ANGIOGRAPHY N/A 10/17/2017   Procedure: RIGHT/LEFT HEART CATH AND CORONARY ANGIOGRAPHY;  Surgeon: Leonie Man, MD;  Location: [place] [facility];  Service: Cardiovascular;:: Normal LVEDP.  Possibly 2+ MR with normal systolic function.  Normal right heart pressures. -->  Increased amlodipine to 10 mg daily  . TRANSTHORACIC ECHOCARDIOGRAM  09/2017   EF 60-65%.  No regional wall motion normalities.  No evidence of elevated PA pressures.  Normal valves.  Essentially normal echo  . TUBAL LIGATION    . VAGINAL HYSTERECTOMY Right 08/09/2018   Procedure: HYSTERECTOMY VAGINAL WITH RIGHT SALPINGECTOMY;  Surgeon: Ena Dawley, MD;  Location: [facility];  Service: Gynecology;  Laterality: Right;     OB History   No obstetric history on file.      Home Medications    Prior to Admission medications   Medication Sig Start Date End Date Taking?  Authorizing Provider  albuterol (VENTOLIN HFA) 108 (90 Base) MCG/ACT inhaler Inhale 1-2 puffs into the lungs every 6 (six) hours as needed for wheezing or shortness of breath.  01/21/19  Yes [provider]  amLODipine (NORVASC) 10 MG tablet Take 1 tablet (10 mg total) by mouth daily. 10/18/17  Yes Leonie Man, MD  budesonide-formoterol [facility]) 80-4.5 MCG/ACT inhaler Inhale 2 puffs into the lungs 2 (two) times daily.   Yes  [provider]  EUTHYROX 150 MCG tablet Take 150 mcg by mouth daily. 02/21/19  Yes [provider]  HYDROmorphone (DILAUDID) 2 MG tablet Take 1 tablet (2 mg total) by mouth every 4 (four) hours as needed for severe pain. 08/10/18  Yes Ena Dawley, MD  ibuprofen (ADVIL,MOTRIN) 800 MG tablet Take 800 mg by mouth 3 (three) times daily as needed for moderate pain or headache. 06/25/18  Yes [provider]  lisinopril (ZESTRIL) 20 MG tablet Take 20 mg by mouth at bedtime. 11/15/18  Yes [provider]  metoprolol tartrate (LOPRESSOR) 50 MG tablet Take 50 mg by mouth 2 (two) times daily.   Yes [provider]  valACYclovir (VALTREX) 500 MG tablet Take 2,000 mg by mouth daily as needed (for fever blisters).    Yes [provider]  lidocaine (LIDODERM) 5 % Place 1 patch onto the skin daily. Remove & Discard patch within 12 hours or as directed by MD 03/11/19   Rodell Perna A, PA-C  predniSONE (DELTASONE) 10 MG tablet TAKE AS DIRECTED TAKE 5 TABLETS (50MG ) BY MOUTH ONCE DAILY FOR 3 DAYS THEN TAKE 4 TABLETS (40MG ) DAILY FOR 3 DAYS THEN TAKE 3 TABLETS (30MG ) 02/20/19   [provider]    Family History Family History  Problem Relation Age of Onset  . Stroke Mother   . Hypertension Mother   . Hypothyroidism Mother   . Seizures Maternal Aunt   . Aneurysm Maternal Aunt   . Heart attack Father 16  . CAD Father        stents  . Hypothyroidism Sister   . Hypertension Maternal Grandmother   . Arthritis Maternal Grandmother   . Hypothyroidism Sister     Social History Social History   Tobacco Use  . Smoking status: Never Smoker  . Smokeless tobacco: Never Used  Substance Use Topics  . Alcohol use: Yes    Comment: occ wine  . Drug use: No     Allergies   Iodinated diagnostic agents, Codeine, Gadolinium derivatives, and Tape   Review of Systems Review of Systems  Constitutional: Negative for chills and fever.  Respiratory:  Positive for cough (improving) and shortness of breath.   Cardiovascular: Positive for chest pain. Negative for leg swelling.  Gastrointestinal: Negative for abdominal pain, nausea and vomiting.  All other systems reviewed and are negative.    Physical Exam Updated Vital Signs BP (!) 143/83   Pulse 68   Temp 98.2 F (36.8 C) (Oral)   Resp 17   Ht 5\' 5"  (1.651 m)   Wt 101.6 kg   LMP 08/08/2018   SpO2 97%   BMI 37.28 kg/m   Physical Exam Vitals signs and nursing note reviewed.  Constitutional:      General: She is not in acute distress.    Appearance: She is well-developed.     Comments: Resting comfortably in bed  HENT:     Head: Normocephalic and atraumatic.  Eyes:     General:        Right  eye: No discharge.        Left eye: No discharge.     Conjunctiva/sclera: Conjunctivae normal.  Neck:     Vascular: No JVD.     Trachea: No tracheal deviation.  Cardiovascular:     Rate and Rhythm: Normal rate and regular rhythm.     Heart sounds: Normal heart sounds.     Comments: 2+ radial and DP/PT pulses bilaterally, Homans sign absent bilaterally, no lower extremity edema, no palpable cords, compartments are soft  Pulmonary:     Effort: Pulmonary effort is normal.     Breath sounds: Normal breath sounds.     Comments: Speaking in full sentences without difficulty. Chest:     Chest wall: Tenderness present.       Comments: Tenderness to palpation along the right lateral costal margin and chest wall with no deformity, crepitus, ecchymosis, or flail segment. Abdominal:     General: Bowel sounds are normal. There is no distension.     Palpations: Abdomen is soft.     Tenderness: There is abdominal tenderness in the right upper quadrant. There is no right CVA tenderness, left CVA tenderness, guarding or rebound. Negative signs include Murphy's sign and Rovsing's sign.  Skin:    General: Skin is warm and dry.     Findings: No erythema.  Neurological:     Mental Status: She  is alert.  Psychiatric:        Behavior: Behavior normal.      ED Treatments / Results  Labs (all labs ordered are listed, but only abnormal results are displayed) Labs Reviewed  COMPREHENSIVE METABOLIC PANEL  CBC WITH DIFFERENTIAL/PLATELET  D-DIMER, QUANTITATIVE (NOT AT [facility])  BRAIN NATRIURETIC PEPTIDE  I-STAT BETA HCG BLOOD, ED ([facility], [facility], [facility] ONLY)  TROPONIN I (HIGH SENSITIVITY)    EKG EKG Interpretation  Date/Time:  Tuesday March 11 2019 19:47:33 EDT Ventricular Rate:  66 PR Interval:    QRS Duration: 89 QT Interval:  432 QTC Calculation: 453 R Axis:   55 Text Interpretation:  Sinus rhythm When compared with ECG of 05/22/2018 No significant change was found Confirmed by Francine Graven [phone]) on 03/11/2019 9:51:54 PM   Radiology Dg Chest Portable 1 View  Result Date: 03/11/2019 CLINICAL DATA:  Pt had COVID back in May. Pt is having pain under right breast in her abdominal area. Has breathing issues when she lies flat. PCP concerned for a PE. Hx HTNsob EXAM: PORTABLE CHEST 1 VIEW COMPARISON:  05/22/2018 FINDINGS: The heart size and mediastinal contours are within normal limits. Both lungs are clear. The visualized skeletal structures are unremarkable. IMPRESSION: No acute cardiopulmonary process. Electronically Signed   By: Suzy Bouchard M.D.   On: 03/11/2019 19:47    Procedures Procedures (including critical care time)  Medications Ordered in ED Medications - No data to display   Initial Impression / Assessment and Plan / ED Course  I have reviewed the triage vital signs and the nursing notes.  Pertinent labs & imaging results that were available during my care of the patient were reviewed by me and considered in my medical decision making (see chart for details).       Patient presenting sent from her PCP for rule out of PE.  Recently had COVID that was diagnosed in May.  Currently complaining of right lower chest pain that has been persistent since her  diagnosis of COVID but worsened 4 days ago.  It is reproducible on palpation.  She is afebrile, vital signs  are stable.  She is nontoxic in appearance.  She is PERC negative.  Her d-dimer is also negative which is reassuring and I doubt PE.  Chest x-ray today shows no acute cardiopulmonary abnormalities.  EKG shows no significant changes from last tracing.  Remainder of her blood work is entirely unremarkable.  She had improvement in her pain with application of a lidocaine patch to the right chest wall.  I suspect her pain is likely musculoskeletal in etiology and I doubt ACS/MI, dissection, cardiac tamponade, pneumonia, esophageal rupture, or pneumothorax.  On reevaluation she is resting comfortably no apparent distress, reports to go home.  Discussed conservative therapy and management with lidocaine patches, NSAIDs, Tylenol, ice or heat.  Recommend follow-up with PCP for reevaluation if symptoms persist.  Discussed strict ED return precautions. Patient verbalized understanding of and agreement with plan and is safe for discharge home at this time.   Final Clinical Impressions(s) / ED Diagnoses   Final diagnoses:  Acute chest wall pain    ED Discharge Orders         Ordered    lidocaine (LIDODERM) 5 %  Every 24 hours     03/11/19 2209           Renita Papa, PA-C 03/12/19 [place], [place], DO 03/14/19 1540

## 2019-03-11 NOTE — ED Triage Notes (Signed)
Pt had COVID back in May. Sees Dr. Hilma Favors. Is having pain under right breast in her abdominal area. Has breathing issues when she lies flat. PCP concerned for a PE.

## 2019-03-11 NOTE — Discharge Instructions (Addendum)
Your work-up today was reassuring that you are not experiencing a heart attack or have a blood clot in your lungs.  No signs of pneumonia.  Continue taking Tylenol as needed for pain.  Do not take more than 4000 mg of Tylenol daily. Continue using your albuterol inhaler 1 to 2 puffs every 4-6 hours as needed for shortness of breath.   Apply lidocaine patch to areas of pain as prescribed.  He can also apply heating pad or ice pack, whichever feels best, 20 minutes at a time 3-4 times daily.  Follow-up with your primary care provider for reevaluation of your symptoms.  Return to the emergency department if any concerning signs or symptoms develop such as fevers, persistent vomiting, worsening cough or chest pains.

## 2019-04-21 ENCOUNTER — Other Ambulatory Visit (HOSPITAL_COMMUNITY): Payer: Self-pay | Admitting: Respiratory Therapy

## 2019-04-21 DIAGNOSIS — R0602 Shortness of breath: Secondary | ICD-10-CM

## 2019-04-23 NOTE — Progress Notes (Signed)
Virtual Visit via Video Note   This visit type was conducted due to national recommendations for restrictions regarding the COVID-19 Pandemic (e.g. social distancing) in an effort to limit this patient's exposure and mitigate transmission in our community.  Due to her co-morbid illnesses, this patient is at least at moderate risk for complications without adequate follow up.  This format is felt to be most appropriate for this patient at this time.  All issues noted in this document were discussed and addressed.  A limited physical exam was performed with this format.  Please refer to the patient's chart for her consent to telehealth for [facility].   Patient has given verbal permission to conduct this visit via virtual appointment and to bill insurance 04/24/2019 8 am.     Date:  04/24/2019   ID:  Tammie Powell, DOB 04/18/1971, MRN [id number]  Patient Location: Home Provider Location: Home  PCP:  Sharilyn Sites, MD  Cardiologist: Dr. Ellyn Hack Electrophysiologist:  None   Evaluation Performed:  Follow-Up Visit  Chief Complaint: Chest pain  History of Present Illness:    Tammie Powell is a 48 y.o. female for ongoing assessment and management of chronic chest pain.  Tammie Powell had a cardiac catheterization on 10/17/2017 revealing normal LVEDP, possible 2+ MR with normal systolic function, normal right heart pressures, with angiographically normal coronary arteries, could not exclude coronary artery spasm.  Amlodipine dose was increased to 10 mg daily.  The patient was last seen by Dr. Ellyn Hack on 11/02/2017 and expressed complaints of just not feeling well since having had her catheterization.  She unfortunately had gained approximately 40 pounds because she had not been exercising or watching her diet.  She continued to complain of chest discomfort.  Dr. Ellyn Hack reported that her symptoms of chest discomfort did improve with amlodipine.  He added Imdur 30 mg at night to help with coronary  spasms.  She was advised to lose weight increase her exercise regimen and avoid high caloric foods.  Since being seen last, she was tested positive for COVID in May 2020. She was seen by pulmonologist and is being treated on close follow up. Continues on inhalers and steroids. She test negative in June, and is having chest pain. Lidocaine patches are helpful but do not control pain.  She was treated with steroids and antiviral medications.  She did have good recovery but has significant sequela of shortness of breath and fatigue with chest discomfort since she was treated for virus.  She believes she contracted the virus from her workplace.  Several other workers there had tested positive and are being treated for [place].  Pulmonology and PCP requested that she follow-up with cardiology for evaluation for any issues from [place] which may have affected cardiac function and caused her ongoing pain.  She was ruled out for PE.  The patient does not have symptoms any longer concerning for COVID-19 infection (fever, chills, cough, or new shortness of breath).    Past Medical History:  Diagnosis Date   Complication of anesthesia    GERD (gastroesophageal reflux disease)    Hypertension    Hypothyroidism    PONV (postoperative nausea and vomiting)    Thyroid disease    Past Surgical History:  Procedure Laterality Date   CERVICAL CONE BIOPSY     CERVICAL CONIZATION W/BX N/A 06/28/2018   Procedure: CONIZATION CERVIX WITH BIOPSY;  Surgeon: Ena Dawley, MD;  Location: [facility];  Service: Gynecology;  Laterality: N/A;   CHOLECYSTECTOMY  RIGHT/LEFT HEART CATH AND CORONARY ANGIOGRAPHY N/A 10/17/2017   Procedure: RIGHT/LEFT HEART CATH AND CORONARY ANGIOGRAPHY;  Surgeon: Leonie Man, MD;  Location: [place] [facility];  Service: Cardiovascular;:: Normal LVEDP.  Possibly 2+ MR with normal systolic function.  Normal right heart pressures. -->  Increased amlodipine to 10 mg daily    TRANSTHORACIC ECHOCARDIOGRAM  09/2017   EF 60-65%.  No regional wall motion normalities.  No evidence of elevated PA pressures.  Normal valves.  Essentially normal echo   TUBAL LIGATION     VAGINAL HYSTERECTOMY Right 08/09/2018   Procedure: HYSTERECTOMY VAGINAL WITH RIGHT SALPINGECTOMY;  Surgeon: Ena Dawley, MD;  Location: [facility];  Service: Gynecology;  Laterality: Right;     Current Meds  Medication Sig   albuterol (VENTOLIN HFA) 108 (90 Base) MCG/ACT inhaler Inhale 1-2 puffs into the lungs every 6 (six) hours as needed for wheezing or shortness of breath.    amLODipine (NORVASC) 10 MG tablet Take 1 tablet (10 mg total) by mouth daily.   budesonide-formoterol (SYMBICORT) 80-4.5 MCG/ACT inhaler Inhale 2 puffs into the lungs 2 (two) times daily.   EUTHYROX 150 MCG tablet Take 150 mcg by mouth daily.   HYDROmorphone (DILAUDID) 2 MG tablet Take 1 tablet (2 mg total) by mouth every 4 (four) hours as needed for severe pain.   lidocaine (LIDODERM) 5 % Place 1 patch onto the skin daily. Remove & Discard patch within 12 hours or as directed by MD   lisinopril (ZESTRIL) 20 MG tablet Take 20 mg by mouth at bedtime.   metoprolol tartrate (LOPRESSOR) 50 MG tablet Take 50 mg by mouth 2 (two) times daily.   valACYclovir (VALTREX) 500 MG tablet Take 2,000 mg by mouth daily as needed (for fever blisters).    [DISCONTINUED] ibuprofen (ADVIL,MOTRIN) 800 MG tablet Take 800 mg by mouth 3 (three) times daily as needed for moderate pain or headache.     Allergies:   Iodinated diagnostic agents, Codeine, Gadolinium derivatives, and Tape   Social History   Tobacco Use   Smoking status: Never Smoker   Smokeless tobacco: Never Used  Substance Use Topics   Alcohol use: Yes    Comment: occ wine   Drug use: No     Family Hx: The patient's family history includes Aneurysm in her maternal aunt; Arthritis in her maternal grandmother; CAD in her father; Heart attack (age  of onset: 25) in her father; Hypertension in her maternal grandmother and mother; Hypothyroidism in her mother, sister, and sister; Seizures in her maternal aunt; Stroke in her mother.  ROS:   Please see the history of present illness.    All other systems reviewed and are negative.   Prior CV studies:   The following studies were reviewed today:  Right and Left Heart Cath 10/17/2017   The left ventricular systolic function is normal.  LV end diastolic pressure is normal.  There is mild (2+) mitral regurgitation.   Angiographically normal coronary arteries.  Nitroglycerin responsive. Cannot exclude coronary spasm. Normal right heart cath pressures.  Plan: Discharge home after bed rest from short stay Increase amlodipine to 10 mg daily.  Echocardiogram 10/04/2018 Left ventricle: The cavity size was normal. Wall thickness was   increased in a pattern of mild LVH. Systolic function was normal.   The estimated ejection fraction was in the range of 60% to 65%.   Wall motion was normal; there were no regional wall motion   abnormalities. Left ventricular diastolic function parameters  were normal. - Aortic valve: Transvalvular velocity was within the normal range.   There was no stenosis. There was no regurgitation. - Mitral valve: Transvalvular velocity was within the normal range.   There was no evidence for stenosis. - Left atrium: The atrium was mildly dilated. - Right ventricle: The cavity size was normal. Wall thickness was   normal. Systolic function was normal. - Tricuspid valve: There was trivial regurgitation. - Pulmonary arteries: PA peak pressure: 25 mm Hg (S).  Labs/Other Tests and Data Reviewed:    EKG:  No ECG reviewed.  Recent Labs: 03/11/2019: ALT 25; B Natriuretic Peptide 22.0; BUN 13; Creatinine, Ser 0.83; Hemoglobin 12.7; Platelets 245; Potassium 3.8; Sodium 137   Recent Lipid Panel No results found for: CHOL, TRIG, HDL, CHOLHDL, LDLCALC, LDLDIRECT  Wt  Readings from Last 3 Encounters:  04/24/19 230 lb (104.3 kg)  03/11/19 224 lb (101.6 kg)  08/09/18 237 lb 8 oz (107.7 kg)     Objective:    Vital Signs:  BP (!) 160/99    Pulse 64    Temp 97.9 F (36.6 C)    Ht 5\' 5"  (1.651 m)    Wt 230 lb (104.3 kg)    LMP 08/08/2018    BMI 38.27 kg/m  Limited assessment due to phone visit VITAL SIGNS:  reviewed GEN:  no acute distress RESPIRATORY:  normal respiratory effort, symmetric expansion PSYCH:  normal affect  ASSESSMENT & PLAN:    1. Chest pain: She has had chronic chest pain in the past but it has intensified since being tested positive for COVID and treated for this in May 2020.  She may have sequela from this virus.  Breathing status continues to be an issue and is being followed by pulmonology.  She states that they felt that her asthma intensified after having been treated for COVID due to lung damage from virus.  She is going to have PFTs completed next week but continues to have discomfort with inspiration and sometimes with supine positioning.  I will schedule echocardiogram for changes in LV function since recovering from COVID virus.  She had a right and left heart cath in February 2020 but if pressures inside her lungs have increased or there are changes in her LV function she may need to have repeat right heart cath.  This will be discussed with Dr. Ellyn Hack once test results are available.  For pain control of neurologic etiology as a sequela of COVID, I will give her tramadol 50 mg 1 p.o. every 8 hours as needed pain.  I have advised that I will not be giving her refills and she should see her PCP for refills on the medication if it has been helpful to her.  She verbalizes understanding.  2.  Hypertension: Blood pressure is elevated on today's visit.  She is just taking her antihypertensive medications.  Blood pressure is normally well controlled and has been documented in primary care physician office at 134/68.  I will continue  current medication regimen of amlodipine, metoprolol, and lisinopril.  No changes in regimen at this time  3.  Asthma: Followed by pulmonologist.  Plan PFTs with treatment strategies per their recommendations.   COVID-19 Education: The signs and symptoms of COVID-19 were discussed with the patient and how to seek care for testing (follow up with PCP or arrange E-visit).  The importance of social distancing was discussed today.  Time:   Today, I have spent 15 minutes with the patient with telehealth technology  discussing the above problems.     Medication Adjustments/Labs and Tests Ordered: Current medicines are reviewed at length with the patient today.  Concerns regarding medicines are outlined above.   Tests Ordered: Orders Placed This Encounter  Procedures   ECHOCARDIOGRAM COMPLETE    Medication Changes: Tramadol 50 mg 1 p.o. every 8 hours as needed pain (no refills)  Disposition:  Follow up 1 month  Signed, Phill Myron. [place] Pugh, ANP, AACC  04/24/2019 8:22 AM    [facility]

## 2019-04-24 ENCOUNTER — Other Ambulatory Visit: Payer: Self-pay

## 2019-04-24 ENCOUNTER — Encounter: Payer: Self-pay | Admitting: Adult Health

## 2019-04-24 ENCOUNTER — Telehealth (INDEPENDENT_AMBULATORY_CARE_PROVIDER_SITE_OTHER): Payer: BC Managed Care – PPO | Admitting: Adult Health

## 2019-04-24 VITALS — BP 160/99 | HR 64 | Temp 97.9°F | Ht 65.0 in | Wt 230.0 lb

## 2019-04-24 DIAGNOSIS — I10 Essential (primary) hypertension: Secondary | ICD-10-CM

## 2019-04-24 DIAGNOSIS — U071 COVID-19: Secondary | ICD-10-CM

## 2019-04-24 DIAGNOSIS — I519 Heart disease, unspecified: Secondary | ICD-10-CM | POA: Diagnosis not present

## 2019-04-24 DIAGNOSIS — J45909 Unspecified asthma, uncomplicated: Secondary | ICD-10-CM

## 2019-04-24 DIAGNOSIS — R079 Chest pain, unspecified: Secondary | ICD-10-CM

## 2019-04-24 MED ORDER — TRAMADOL HCL 50 MG PO TABS: 50.0000 mg | ORAL_TABLET | Freq: Three times a day (TID) | ORAL | 0 refills | Status: DC | PRN

## 2019-04-24 NOTE — Patient Instructions (Signed)
Medication Instructions:  Continue current medications  If you need a refill on your cardiac medications before your next appointment, please call your pharmacy.  Labwork: None Ordered  Testing/Procedures: Your physician has requested that you have an echocardiogram. Echocardiography is a painless test that uses sound waves to create images of your heart. It provides your doctor with information about the size and shape of your heart and how well your heart's chambers and valves are working. This procedure takes approximately one hour. There are no restrictions for this procedure.  Follow-Up: . Your physician recommends that you schedule a follow-up appointment in: 1 Month with Dr Ellyn Hack or [place], you and your health needs are our priority.  As part of our continuing mission to provide you with exceptional heart care, we have created designated Provider Care Teams.  These Care Teams include your primary Cardiologist (physician) and Advanced Practice Providers (APPs -  Physician Assistants and Nurse Practitioners) who all work together to provide you with the care you need, when you need it.  Thank you for choosing [facility] at [facility]!!

## 2019-04-29 NOTE — Telephone Encounter (Signed)
-----   Message from Lendon Colonel, NP sent at 04/28/2019  2:29 PM EDT ----- Tammie Powell needs a note to be out of work until Sept 7th, as she is having a lot of testing which will not allow her to be at work fulltime. She has been out for COVID and is planned for echo and PFT's.  I told her it would be tomorrow before we could get it to her.   KL

## 2019-04-30 ENCOUNTER — Other Ambulatory Visit: Payer: Self-pay

## 2019-04-30 ENCOUNTER — Ambulatory Visit (HOSPITAL_COMMUNITY): Payer: BC Managed Care – PPO | Attending: Cardiovascular Disease

## 2019-04-30 DIAGNOSIS — I519 Heart disease, unspecified: Secondary | ICD-10-CM | POA: Diagnosis present

## 2019-05-08 ENCOUNTER — Telehealth: Payer: Self-pay | Admitting: Adult Health

## 2019-05-08 NOTE — Telephone Encounter (Signed)
See message regarding letter. Thanks!

## 2019-05-08 NOTE — Telephone Encounter (Signed)
New Message   Patient is calling because she is unable to come into the office and pick up her return to work letter. She is requesting that it be emailed to her. Please call to discuss.

## 2019-05-12 NOTE — Telephone Encounter (Signed)
Letter route to pt MyChart

## 2019-05-13 ENCOUNTER — Other Ambulatory Visit (HOSPITAL_COMMUNITY)
Admission: RE | Admit: 2019-05-13 | Discharge: 2019-05-13 | Disposition: A | Discharge status: Home / Self Care | Payer: BC Managed Care – PPO | Source: Ambulatory Visit | Attending: Pulmonary Disease | Admitting: Pulmonary Disease

## 2019-05-13 ENCOUNTER — Other Ambulatory Visit: Payer: Self-pay

## 2019-05-13 DIAGNOSIS — Z20828 Contact with and (suspected) exposure to other viral communicable diseases: Secondary | ICD-10-CM | POA: Diagnosis present

## 2019-05-13 DIAGNOSIS — Z01812 Encounter for preprocedural laboratory examination: Secondary | ICD-10-CM | POA: Diagnosis not present

## 2019-05-13 LAB — SARS CORONAVIRUS 2 (TAT 6-24 HRS): SARS Coronavirus 2: NEGATIVE

## 2019-05-15 ENCOUNTER — Ambulatory Visit (HOSPITAL_COMMUNITY)
Admission: RE | Admit: 2019-05-15 | Discharge: 2019-05-15 | Disposition: A | Discharge status: Home / Self Care | Payer: BC Managed Care – PPO | Source: Ambulatory Visit | Attending: Pulmonary Disease | Admitting: Pulmonary Disease

## 2019-05-15 ENCOUNTER — Other Ambulatory Visit: Payer: Self-pay

## 2019-05-15 DIAGNOSIS — R0602 Shortness of breath: Secondary | ICD-10-CM | POA: Diagnosis present

## 2019-05-15 LAB — PULMONARY FUNCTION TEST
DL/VA % pred: 110 %
DL/VA: 4.77 ml/min/mmHg/L
DLCO unc % pred: 105 %
DLCO unc: 23.2 ml/min/mmHg
FEF 25-75 Post: 2.59 L/sec
FEF 25-75 Pre: 2.8 L/sec
FEF2575-%Change-Post: -7 %
FEF2575-%Pred-Post: 89 %
FEF2575-%Pred-Pre: 96 %
FEV1-%Change-Post: -1 %
FEV1-%Pred-Post: 92 %
FEV1-%Pred-Pre: 93 %
FEV1-Post: 2.73 L
FEV1-Pre: 2.76 L
FEV1FVC-%Change-Post: 1 %
FEV1FVC-%Pred-Pre: 98 %
FEV6-%Change-Post: -1 %
FEV6-%Pred-Post: 93 %
FEV6-%Pred-Pre: 94 %
FEV6-Post: 3.38 L
FEV6-Pre: 3.42 L
FEV6FVC-%Change-Post: 0 %
FEV6FVC-%Pred-Post: 102 %
FEV6FVC-%Pred-Pre: 101 %
FVC-%Change-Post: -2 %
FVC-%Pred-Post: 91 %
FVC-%Pred-Pre: 94 %
FVC-Post: 3.39 L
FVC-Pre: 3.49 L
Post FEV1/FVC ratio: 80 %
Post FEV6/FVC ratio: 100 %
Pre FEV1/FVC ratio: 79 %
Pre FEV6/FVC Ratio: 99 %
RV % pred: 111 %
RV: 2.01 L
TLC % pred: 105 %
TLC: 5.51 L

## 2019-05-15 MED ORDER — ALBUTEROL SULFATE (2.5 MG/3ML) 0.083% IN NEBU
2.5000 mg | INHALATION_SOLUTION | Freq: Once | RESPIRATORY_TRACT | Status: AC
  Administered 2019-05-15: 09:00:00 2.5 mg via RESPIRATORY_TRACT

## 2019-05-25 NOTE — Progress Notes (Signed)
Cardiology Office Note   Date:  05/26/2019   ID:  TANGIE CRAWFORD, DOB 1970-09-16, MRN [id number]  PCP:  Sharilyn Sites, MD  Cardiologist:  Dr.Harding  CC: Follow Up    History of Present Illness: Tammie Powell is a 48 y.o. female who presents for ongoing assessment and management of chronic chest pain, with angiographically normal coronary arteries per cardiac catheterization on 10/17/2017 with normal LVEDP, possible 2+ MR with normal systolic function and normal right heart pressures.  Other history includes positive COVID test in May 2020, and is being followed by pulmonologist.  She was continued on inhalers and steroids.  Repeat COVID test in June was negative.  She continues to have chronic pain and lidocaine patches are helpful but do not control pain.  She continues chronic shortness of breath and fatigue with chest discomfort since being treated for the virus.  She is scheduled to have PFTs.  She was scheduled for an echocardiogram to evaluate for changes in LV function.  She was given prescription for tramadol 50 mg 1 p.o. every 8 hours as needed for pain and the refills were to be given by her primary care physician.  Echocardiogram on 04/30/2019 revealed a normal study compared to prior echocardiogram on 10/04/2017.  EF of 60% to 65%, cavity size was normal.  There were no valvular abnormalities, no regional wall motion abnormalities.  No evidence of myocarditis, pleural effusion, cardiomyopathy.   She comes today for follow up and is feeling better. She still has some pain in the lower right side at the ribs but no cardiac pain. She still gets a little dyspneic with exertion but this is getting better. She is using [employer] officer, nature and other exercise equipment daily to build up strength and stamina. She continues to be followed by pulmonary.   Past Medical History:  Diagnosis Date  . Complication of anesthesia   . GERD (gastroesophageal reflux disease)   . Hypertension   . Hypothyroidism    . PONV (postoperative nausea and vomiting)   . Thyroid disease     Past Surgical History:  Procedure Laterality Date  . CERVICAL CONE BIOPSY    . CERVICAL CONIZATION W/BX N/A 06/28/2018   Procedure: CONIZATION CERVIX WITH BIOPSY;  Surgeon: Ena Dawley, MD;  Location: [facility];  Service: Gynecology;  Laterality: N/A;  . CHOLECYSTECTOMY    . RIGHT/LEFT HEART CATH AND CORONARY ANGIOGRAPHY N/A 10/17/2017   Procedure: RIGHT/LEFT HEART CATH AND CORONARY ANGIOGRAPHY;  Surgeon: Leonie Man, MD;  Location: [facility];  Service: Cardiovascular;:: Normal LVEDP.  Possibly 2+ MR with normal systolic function.  Normal right heart pressures. -->  Increased amlodipine to 10 mg daily  . TRANSTHORACIC ECHOCARDIOGRAM  09/2017   EF 60-65%.  No regional wall motion normalities.  No evidence of elevated PA pressures.  Normal valves.  Essentially normal echo  . TUBAL LIGATION    . VAGINAL HYSTERECTOMY Right 08/09/2018   Procedure: HYSTERECTOMY VAGINAL WITH RIGHT SALPINGECTOMY;  Surgeon: Ena Dawley, MD;  Location: [facility];  Service: Gynecology;  Laterality: Right;     Current Outpatient Medications  Medication Sig Dispense Refill  . albuterol (VENTOLIN HFA) 108 (90 Base) MCG/ACT inhaler Inhale 1-2 puffs into the lungs every 6 (six) hours as needed for wheezing or shortness of breath.     Marland Kitchen amLODipine (NORVASC) 10 MG tablet Take 1 tablet (10 mg total) by mouth daily. 90 tablet 4  . budesonide-formoterol (SYMBICORT) 80-4.5 MCG/ACT inhaler Inhale 2 puffs  into the lungs 2 (two) times daily.    . EUTHYROX 150 MCG tablet Take 150 mcg by mouth daily.    Marland Kitchen HYDROmorphone (DILAUDID) 2 MG tablet Take 1 tablet (2 mg total) by mouth every 4 (four) hours as needed for severe pain. 20 tablet 0  . lidocaine (LIDODERM) 5 % Place 1 patch onto the skin daily. Remove & Discard patch within 12 hours or as directed by MD 30 patch 0  . lisinopril (ZESTRIL) 20 MG tablet Take 20 mg by mouth at  bedtime.    . metoprolol tartrate (LOPRESSOR) 50 MG tablet Take 50 mg by mouth 2 (two) times daily.    . valACYclovir (VALTREX) 500 MG tablet Take 2,000 mg by mouth daily as needed (for fever blisters).     . traMADol (ULTRAM) 50 MG tablet Take 1 tablet (50 mg total) by mouth every 8 (eight) hours as needed. 30 tablet 0   No current facility-administered medications for this visit.     Allergies:   Iodinated diagnostic agents, Codeine, Gadolinium derivatives, and Tape    Social History:  The patient  reports that she has never smoked. She has never used smokeless tobacco. She reports current alcohol use. She reports that she does not use drugs.   Family History:  The patient's family history includes Aneurysm in her maternal aunt; Arthritis in her maternal grandmother; CAD in her father; Heart attack (age of onset: 35) in her father; Hypertension in her maternal grandmother and mother; Hypothyroidism in her mother, sister, and sister; Seizures in her maternal aunt; Stroke in her mother.    ROS: All other systems are reviewed and negative. Unless otherwise mentioned in H&P    PHYSICAL EXAM: VS:  BP 126/80   Pulse 80   Temp 97.9 F (36.6 C)   Ht 5\' 5"  (1.651 m)   Wt 230 lb (104.3 kg)   LMP 08/08/2018   SpO2 98%   BMI 38.27 kg/m  , BMI Body mass index is 38.27 kg/m. GEN: Well nourished, well developed, in no acute distress HEENT: normal Neck: no JVD, carotid bruits, or masses Cardiac: RRR; no murmurs, rubs, or gallops,no edema  Respiratory:  Clear to auscultation bilaterally, normal work of breathing GI: soft, nontender, nondistended, + BS MS: no deformity or atrophy Skin: warm and dry, no rash Neuro:  Strength and sensation are intact Psych: euthymic mood, full affect   EKG:  No completed today.   Recent Labs: 03/11/2019: ALT 25; B Natriuretic Peptide 22.0; BUN 13; Creatinine, Ser 0.83; Hemoglobin 12.7; Platelets 245; Potassium 3.8; Sodium 137    Lipid Panel No results  found for: CHOL, TRIG, HDL, CHOLHDL, VLDL, LDLCALC, LDLDIRECT    Wt Readings from Last 3 Encounters:  05/26/19 230 lb (104.3 kg)  04/24/19 230 lb (104.3 kg)  03/11/19 224 lb (101.6 kg)      Other studies Reviewed: Echocardiogram 05-18-2019 1. When compared to the prior study: No change from 10/04/2017. Normal study then and now.  2. The left ventricle has normal systolic function with an ejection fraction of 60-65%. The cavity size was normal. Left ventricular diastolic parameters were normal. No evidence of left ventricular regional wall motion abnormalities.  3. The right ventricle has normal systolic function. The cavity was normal. There is no increase in right ventricular wall thickness. Right ventricular systolic pressure is normal.  4. The pericardial effusion is circumferential.  5. Trivial pericardial effusion is present.  6. The mitral valve is grossly normal.  7. The  tricuspid valve is grossly normal.  8. The aortic valve is tricuspid. No stenosis of the aortic valve.  9. The aorta is normal unless otherwise noted. 10. The aortic root and ascending aorta are normal in size and structure.   ASSESSMENT AND PLAN:  1.  Chronic dyspnea: She is status post COVID-19 recovery, echocardiogram did not show evidence of any cardiomyopathy, valvular heart disease, or significant pericardial effusion.  RV pressures were normal.  She also had PFTs completed which did not show significant lung disease.  She will continue to follow with pulmonologist for ongoing management as she does have a history of asthma.  I have advised her to continue her exercise regimen to increase her stamina.  Consider sleep study at the discretion of pulmonology.  2.  Hypertension: Blood pressures currently well controlled.  We will not make any changes in her medicine at this time.  Continue lisinopril 20 mg at at bedtime, amlodipine 10 mg daily, along with metoprolol 50 mg twice daily.  I do not hear any wheezing  during her lung exam and therefore metoprolol will not be discontinued at this time.  3.  Hypothyroidism: Continue to follow PCP.  She is concerned about weight gain despite exercising every day.  She asked me about a diet.  I have advised her on the Mediterranean diet with low carbs and increase fruits and vegetables.   Current medicines are reviewed at length with the patient today.    Labs/ tests ordered today include: None  Phill Myron. West Pugh, ANP, [facility]   05/26/2019 9:44 AM    [address] Office [phone] Fax [phone]

## 2019-05-26 ENCOUNTER — Encounter: Payer: Self-pay | Admitting: Adult Health

## 2019-05-26 ENCOUNTER — Ambulatory Visit: Payer: BC Managed Care – PPO | Admitting: Adult Health

## 2019-05-26 ENCOUNTER — Other Ambulatory Visit: Payer: Self-pay

## 2019-05-26 VITALS — BP 126/80 | HR 80 | Temp 97.9°F | Ht 65.0 in | Wt 230.0 lb

## 2019-05-26 DIAGNOSIS — U071 COVID-19: Secondary | ICD-10-CM | POA: Diagnosis not present

## 2019-05-26 DIAGNOSIS — E039 Hypothyroidism, unspecified: Secondary | ICD-10-CM

## 2019-05-26 DIAGNOSIS — J988 Other specified respiratory disorders: Secondary | ICD-10-CM

## 2019-05-26 DIAGNOSIS — R0609 Other forms of dyspnea: Secondary | ICD-10-CM

## 2019-05-26 DIAGNOSIS — I10 Essential (primary) hypertension: Secondary | ICD-10-CM | POA: Diagnosis not present

## 2019-05-26 MED ORDER — TRAMADOL HCL 50 MG PO TABS: 50.0000 mg | ORAL_TABLET | Freq: Three times a day (TID) | ORAL | 0 refills | Status: DC | PRN

## 2019-05-26 NOTE — Patient Instructions (Signed)
Medication Instructions:  Continue current medications  If you need a refill on your cardiac medications before your next appointment, please call your pharmacy.  Labwork: None Ordered   Testing/Procedures: None Ordered  Follow-Up: You will need a follow up appointment in 1 Year.  Please call our office 2 months in advance to schedule this appointment.  You may see Dr Harding or one of the following Advanced Practice Providers on your designated Care Team:   Rhonda Barrett, PA-C . Kathryn Lawrence, DNP, ANP     At [facility], you and your health needs are our priority.  As part of our continuing mission to provide you with exceptional heart care, we have created designated Provider Care Teams.  These Care Teams include your primary Cardiologist (physician) and Advanced Practice Providers (APPs -  Physician Assistants and Nurse Practitioners) who all work together to provide you with the care you need, when you need it.  Thank you for choosing [facility] at [facility]!!     

## 2019-09-19 ENCOUNTER — Other Ambulatory Visit: Payer: Self-pay

## 2019-09-19 ENCOUNTER — Encounter: Payer: Self-pay | Admitting: Cardiology

## 2019-09-19 ENCOUNTER — Ambulatory Visit: Payer: BC Managed Care – PPO | Admitting: Cardiology

## 2019-09-19 VITALS — BP 151/100 | HR 75 | Ht 65.0 in | Wt 249.8 lb

## 2019-09-19 DIAGNOSIS — R0609 Other forms of dyspnea: Secondary | ICD-10-CM

## 2019-09-19 DIAGNOSIS — Z8249 Family history of ischemic heart disease and other diseases of the circulatory system: Secondary | ICD-10-CM | POA: Diagnosis not present

## 2019-09-19 DIAGNOSIS — I10 Essential (primary) hypertension: Secondary | ICD-10-CM | POA: Diagnosis not present

## 2019-09-19 DIAGNOSIS — I201 Angina pectoris with documented spasm: Secondary | ICD-10-CM | POA: Diagnosis not present

## 2019-09-19 DIAGNOSIS — G459 Transient cerebral ischemic attack, unspecified: Secondary | ICD-10-CM | POA: Diagnosis not present

## 2019-09-19 DIAGNOSIS — R0600 Dyspnea, unspecified: Secondary | ICD-10-CM

## 2019-09-19 MED ORDER — SPIRONOLACTONE 25 MG PO TABS: 25.0000 mg | ORAL_TABLET | Freq: Every day | ORAL | 3 refills | Status: DC

## 2019-09-19 MED ORDER — CARVEDILOL 12.5 MG PO TABS: 12.5000 mg | ORAL_TABLET | Freq: Two times a day (BID) | ORAL | 3 refills | Status: DC

## 2019-09-19 NOTE — Patient Instructions (Signed)
Medication Instructions:   STOP Metoprolol  START Carvedilol 12.5 mg--take 1/2 tab twice daily for 6 days; then, take 1/2 tab AM and 1 tab PM for 6 days; then take 1 tab twice daily.  START Spironolactone 25 mg daily.  *If you need a refill on your cardiac medications before your next appointment, please call your pharmacy*  Lab Work: Your physician recommends that you return for lab work in 2-3 weeks: BMET. You do not need an appointment to have blood work done in our office.  If you have labs (blood work) drawn today and your tests are completely normal, you will receive your results only by: Marland Kitchen MyChart Message (if you have MyChart) OR . A paper copy in the mail If you have any lab test that is abnormal or we need to change your treatment, we will call you to review the results.  Follow-Up: At [facility], you and your health needs are our priority.  As part of our continuing mission to provide you with exceptional heart care, we have created designated Provider Care Teams.  These Care Teams include your primary Cardiologist (physician) and Advanced Practice Providers (APPs -  Physician Assistants and Nurse Practitioners) who all work together to provide you with the care you need, when you need it.  Your next appointment:   6 month(s)  The format for your next appointment:   In Person  Provider:   You may see Glenetta Hew, MD or one of the following Advanced Practice Providers on your designated Care Team:    Rosaria Ferries, PA-C  Jory Sims, DNP, ANP  Cadence Kathlen Mody, NP   Other Instructions Please schedule an appointment with our Pharmacist in 4-6 weeks.

## 2019-09-19 NOTE — Progress Notes (Signed)
Primary Care Provider: Sharilyn Sites, MD Cardiologist: No primary care provider on file. Neurologist: Dr. Thressa Sheller:   Clinic Note: Chief Complaint  Patient presents with  . Hospitalization Follow-up    January 8-[place] [facility] for TIA symptoms  . Chest Pain    History of coronary spasm.  Stable    HPI:    Tammie Powell is a 49 y.o. female with a PMH notable for hypertension and mostly resting chest pain who presents today for 37-month and hospital ER follow-up.  Tammie Powell was last seen by me back in March 2019 in follow-up from her catheterization.  She said that she just was not really feeling very well after her cath and had gained about 40 pounds.  She had 2 episodes of chest pain without response to nitroglycerin. -> Heart rate was controlled with beta-blocker.  Amlodipine used for possible coronary spasm and Imdur added.  She was subsequently seen in August and September 2020 by Jory Sims, NP  April 24, 2019-telemedicine--noted that she had positive for Covid in May (presumably contracted from the workplace).  Was placed on inhalers and steroids.  Follow-up test in June was negative.  Was still having some chest wall pain.  Recent Hospitalizations:   ER visit [place], [place] -> questionable TIA symptoms with headache and numbness.  Blood pressure upon arrival was 167/90 millimeters but was higher prior to.  Also noted heart rate in the 47 to 50 bpm range..  Reviewed  CV studies:    The following studies were reviewed today: (if available, images/films reviewed: From Epic Chart or Care Everywhere) . Echocardiogram September 2020: EF 60 to 65%.  Normal wall motion.  Mild left atrial dilation suggestive of mild diastolic dysfunction (however parameters were normal.  Normal RV size and function.   Interval History:   Tammie Powell presents here today for ER follow-up for recent TIA evaluation.  She was noted to be hypertensive.  She is back to normal now, but just  feels little bit off.  She does not necessarily noted any irregular heartbeats or palpitations. In fact, her chest pain is pretty much not present.  Has not had to use nitroglycerin in a long time.  Otherwise, is doing relatively well.  No longer really having that much of a headache and no other neurologic symptoms.  CV Review of Symptoms (Summary): positive for - edema and She has had that recent TIA symptoms, and has had some exertional shortness of breath because of deconditioning but no real profound exertional dyspnea. negative for - chest pain, irregular heartbeat, orthopnea, palpitations, paroxysmal nocturnal dyspnea, rapid heart rate, shortness of breath or Syncope/near syncope.  Claudication  The patient does not have symptoms concerning for COVID-19 infection (fever, chills, cough, or new shortness of breath).  The patient is practicing social distancing and masking.  REVIEWED OF SYSTEMS   A comprehensive ROS was performed. Review of Systems  Constitutional: Negative for malaise/fatigue and weight loss.  HENT: Negative for congestion and nosebleeds.   Eyes: Positive for blurred vision.  Respiratory: Negative for cough.   Gastrointestinal: Negative for blood in stool and melena.  Genitourinary: Negative for hematuria.  Musculoskeletal: Negative for falls and joint pain.  Neurological: Positive for dizziness, tingling (With numbness during her episode on January 8) and sensory change (Numbness during TIA episode.).  Psychiatric/Behavioral: Negative for memory loss. The patient is nervous/anxious (A little bit anxious after recent episode but otherwise doing fine). The patient does not have insomnia.  I have reviewed and (if needed) personally updated the patient's problem list, medications, allergies, past medical and surgical history, social and family history.   PAST MEDICAL HISTORY   Past Medical History:  Diagnosis Date  . Complication of anesthesia   . GERD  (gastroesophageal reflux disease)   . Hypertension   . Hypothyroidism   . PONV (postoperative nausea and vomiting)   . Thyroid disease      PAST SURGICAL HISTORY   Past Surgical History:  Procedure Laterality Date  . CERVICAL CONE BIOPSY    . CERVICAL CONIZATION W/BX N/A 06/28/2018   Procedure: CONIZATION CERVIX WITH BIOPSY;  Surgeon: Ena Dawley, MD;  Location: [facility];  Service: Gynecology;  Laterality: N/A;  . CHOLECYSTECTOMY    . RIGHT/LEFT HEART CATH AND CORONARY ANGIOGRAPHY N/A 10/17/2017   Procedure: RIGHT/LEFT HEART CATH AND CORONARY ANGIOGRAPHY;  Surgeon: Leonie Man, MD;  Location: [facility];  Service: Cardiovascular;:: Normal LVEDP.  Possibly 2+ MR with normal systolic function.  Normal right heart pressures. -->  Increased amlodipine to 10 mg daily  . TRANSTHORACIC ECHOCARDIOGRAM  02/'19; 9/'20   EF 60-65%.  No RWMA.  Normal PA pressures.  Normal valves.  Essentially normal echo;; 9/'20:  EF 60 to 65%.  Normal wall motion.  Mild left atrial dilation suggestive of mild diastolic dysfunction (however parameters were normal.  Normal RV size and function.  . TUBAL LIGATION    . VAGINAL HYSTERECTOMY Right 08/09/2018   Procedure: HYSTERECTOMY VAGINAL WITH RIGHT SALPINGECTOMY;  Surgeon: Ena Dawley, MD;  Location[facility];  Service: Gynecology;  Laterality: Right;    MEDICATIONS/ALLERGIES   Current Meds  Medication Sig  . albuterol (VENTOLIN HFA) 108 (90 Base) MCG/ACT inhaler Inhale 1-2 puffs into the lungs every 6 (six) hours as needed for wheezing or shortness of breath.   Marland Kitchen amLODipine (NORVASC) 10 MG tablet Take 1 tablet (10 mg total) by mouth daily.  . budesonide-formoterol (SYMBICORT) 80-4.5 MCG/ACT inhaler Inhale 2 puffs into the lungs 2 (two) times daily.  Marland Kitchen HYDROmorphone (DILAUDID) 2 MG tablet Take 1 tablet (2 mg total) by mouth every 4 (four) hours as needed for severe pain.  Marland Kitchen levothyroxine (EUTHYROX) 175 MCG tablet Take 175 mcg  by mouth daily before breakfast.  . lidocaine (LIDODERM) 5 % Place 1 patch onto the skin daily. Remove & Discard patch within 12 hours or as directed by MD  . lisinopril (ZESTRIL) 20 MG tablet Take 20 mg by mouth at bedtime.  Marland Kitchen ROSUVASTATIN CALCIUM PO Take by mouth daily.  . traMADol (ULTRAM) 50 MG tablet Take 1 tablet (50 mg total) by mouth every 8 (eight) hours as needed.  . valACYclovir (VALTREX) 500 MG tablet Take 2,000 mg by mouth daily as needed (for fever blisters).   . [DISCONTINUED] EUTHYROX 150 MCG tablet Take 150 mcg by mouth daily.  . [DISCONTINUED] metoprolol tartrate (LOPRESSOR) 50 MG tablet Take 50 mg by mouth 2 (two) times daily.    Allergies  Allergen Reactions  . Iodinated Diagnostic Agents Itching  . Codeine Hives, Itching and Other (See Comments)    burning  . Gadolinium Derivatives Hives and Other (See Comments)    Patient had a few hives that were mildly itchy. Checked by dr Carlis Abbott, since severity was so low, and patient had no driver, he recommended she take benadryl at home. She remained in office for 40 minutes post injection to be observed.   . Tape Swelling and Rash  SOCIAL HISTORY/FAMILY HISTORY   Social History   Tobacco Use  . Smoking status: Never Smoker  . Smokeless tobacco: Never Used  Substance Use Topics  . Alcohol use: Yes    Comment: occ wine  . Drug use: No   Social History   Social History Narrative   She currently works at a [employer], building tires   She never smoked. Does not do routine exercise, but has recently started adjusting her diet.    Family History family history includes Aneurysm in her maternal aunt; Arthritis in her maternal grandmother; CAD in her father; Heart attack (age of onset: 76) in her father; Hypertension in her maternal grandmother and mother; Hypothyroidism in her mother, sister, and sister; Seizures in her maternal aunt; Stroke in her mother.   OBJCTIVE -PE, EKG, labs   Wt Readings from Last 3  Encounters:  09/19/19 249 lb 12.8 oz (113.3 kg)  05/26/19 230 lb (104.3 kg)  04/24/19 230 lb (104.3 kg)    Physical Exam: BP (!) 151/100   Pulse 75   Ht 5\' 5"  (1.651 m)   Wt 249 lb 12.8 oz (113.3 kg)   LMP 08/08/2018   SpO2 95%   BMI 41.57 kg/m  Physical Exam  Constitutional: She is oriented to person, place, and time. She appears well-developed and well-nourished. No distress.  Morbidly obese.  Well-groomed.  HENT:  Head: Normocephalic and atraumatic.  Eyes: Conjunctivae and EOM are normal.  Neck: No hepatojugular reflux and no JVD present. Carotid bruit is not present. No thyromegaly present.  Cardiovascular: Normal rate, regular rhythm, S1 normal, S2 normal and intact distal pulses.  No extrasystoles are present. PMI is not displaced (Difficult to palpate due to body habitus). Exam reveals distant heart sounds. Exam reveals no gallop and no friction rub.  No murmur heard. Pulmonary/Chest: Effort normal and breath sounds normal. No respiratory distress. She has no wheezes. She has no rales. She exhibits no tenderness.  Musculoskeletal:        General: No edema. Normal range of motion.     Cervical back: Normal range of motion and neck supple.  Neurological: She is alert and oriented to person, place, and time. No cranial nerve deficit.  Psychiatric: She has a normal mood and affect. Her behavior is normal. Judgment and thought content normal.  Vitals reviewed.   Adult ECG Report Not checked  Recent Labs:    No results found for: CHOL, HDL, LDLCALC, LDLDIRECT, TRIG, CHOLHDL  -> lipids from March 2019 showed LDL 114, TC 195.  HDL 53 and TG 138.  A1c 5.2. Lab Results  Component Value Date   CREATININE 0.83 03/11/2019   BUN 13 03/11/2019   NA 137 03/11/2019   K 3.8 03/11/2019   CL 105 03/11/2019   CO2 24 03/11/2019    ASSESSMENT/PLAN    Problem List Items Addressed This Visit    Family history of premature coronary artery disease (Chronic)    Minimal CAD noted on  cardiac cath. Presumably, her PCP is following her lipids.  Last check I saw was about 2 years ago with an LDL of 114.  Would probably want to have LDL less than 100.  Defer evaluation to PCP for now, but if not checked by follow-up we will recheck.      Essential hypertension - Primary (Chronic)    Blood pressure is pretty high today.  She is on 10 mg of amlodipine and 20 of lisinopril both of which are at max dose.  With there being concern for bradycardia and now wanting more better blood pressure control, I am to convert her from Lopressor 50 mg twice daily to carvedilol with plans to titrate up from 6.25 mg twice daily up to 12.5 mg twice daily.  I will also start spironolactone at 25 mg daily for blood pressure and some mild edema.  She will follow-up with our clinical pharmacist in [facility] to reassess blood pressure after titrating to 12.5 mg twice daily carvedilol.      Relevant Medications   ROSUVASTATIN CALCIUM PO   spironolactone (ALDACTONE) 25 MG tablet   carvedilol (COREG) 12.5 MG tablet   Other Relevant Orders   Basic metabolic panel   Coronary artery spasm (HCC) (Chronic)    We did not clearly demonstrate this, but was felt to be consistent with symptoms.  Well-controlled now that she is on amlodipine.  No longer on Imdur because of headache.  Plan: We will convert beta-blocker to carvedilol for better blood pressure control given recent hypertension/TIA episode.      Relevant Medications   ROSUVASTATIN CALCIUM PO   spironolactone (ALDACTONE) 25 MG tablet   carvedilol (COREG) 12.5 MG tablet   Other Relevant Orders   Basic metabolic panel   TIA (transient ischemic attack)    She says she had a complete work[facility]up at the [facility], but I do not have any reports.  I do not want to order any studies on her at this point.  She has had a relatively benign echocardiogram done recently.  She never had any sensation of any irregular heartbeats palpitations some not sure how  beneficial a monitor would be, but however if there is recurrence then we would talk about potential loop recorder.  Could consider carotid Dopplers if not done in [place]. --> Will forward note to our clinical pharmacist.  If this was not done, we can order carotid Dopplers prior to follow-up.      Relevant Medications   ROSUVASTATIN CALCIUM PO   spironolactone (ALDACTONE) 25 MG tablet   carvedilol (COREG) 12.5 MG tablet   DOE (dyspnea on exertion)    Overall is doing fairly well.  I suspect if he get blood pressure under control and lose some weight, it would improve.          COVID-19 Education: The signs and symptoms of COVID-19 were discussed with the patient and how to seek care for testing (follow up with PCP or arrange E-visit).   The importance of social distancing was discussed today.  I spent a total of 23minutes with the patient. >  50% of the time was spent in direct patient consultation.  Additional time spent with chart review (studies, outside notes, etc): 6 Total Time: 28 min   Current medicines are reviewed at length with the patient today.  (+/- concerns) N/A   Patient Instructions / Medication Changes & Studies & Tests Ordered   Patient Instructions  Medication Instructions:   STOP Metoprolol  START Carvedilol 12.5 mg--take 1/2 tab twice daily for 6 days; then, take 1/2 tab AM and 1 tab PM for 6 days; then take 1 tab twice daily.  START Spironolactone 25 mg daily.  *If you need a refill on your cardiac medications before your next appointment, please call your pharmacy*  Lab Work: Your physician recommends that you return for lab work in 2-3 weeks: BMET. You do not need an appointment to have blood work done in our office.  If you have labs (blood work)  drawn today and your tests are completely normal, you will receive your results only by: Marland Kitchen MyChart Message (if you have MyChart) OR . A paper copy in the mail If you have any lab test that is  abnormal or we need to change your treatment, we will call you to review the results.  Follow-Up: At [facility], you and your health needs are our priority.  As part of our continuing mission to provide you with exceptional heart care, we have created designated Provider Care Teams.  These Care Teams include your primary Cardiologist (physician) and Advanced Practice Providers (APPs -  Physician Assistants and Nurse Practitioners) who all work together to provide you with the care you need, when you need it.  Your next appointment:   6 month(s)  The format for your next appointment:   In Person  Provider:   You may see Glenetta Hew, MD or one of the following Advanced Practice Providers on your designated Care Team:    Rosaria Ferries, PA-C  Jory Sims, DNP, ANP  Cadence Kathlen Mody, NP   Other Instructions Please schedule an appointment with our Pharmacist in 4-6 weeks.     Studies Ordered:   Orders Placed This Encounter  Procedures  . Basic metabolic panel     Glenetta Hew, M.D., M.S. Interventional Cardiologist   Pager # [phone] Phone # [phone] [address] [place], [place] [address]   Thank you for choosing Heartcare at [facility]!!

## 2019-09-24 ENCOUNTER — Encounter: Payer: Self-pay | Admitting: Cardiology

## 2019-09-24 ENCOUNTER — Telehealth: Payer: Self-pay | Admitting: *Deleted

## 2019-09-24 DIAGNOSIS — G459 Transient cerebral ischemic attack, unspecified: Secondary | ICD-10-CM | POA: Insufficient documentation

## 2019-09-24 NOTE — Assessment & Plan Note (Addendum)
Blood pressure is pretty high today.  She is on 10 mg of amlodipine and 20 of lisinopril both of which are at max dose.  With there being concern for bradycardia and now wanting more better blood pressure control, I am to convert her from Lopressor 50 mg twice daily to carvedilol with plans to titrate up from 6.25 mg twice daily up to 12.5 mg twice daily.  I will also start spironolactone at 25 mg daily for blood pressure and some mild edema.  She will follow-up with our clinical pharmacist in [facility] to reassess blood pressure after titrating to 12.5 mg twice daily carvedilol.

## 2019-09-24 NOTE — Telephone Encounter (Signed)
Left message for patient to call and schedule 4-6 week appt with Pharm D

## 2019-09-24 NOTE — Assessment & Plan Note (Signed)
We did not clearly demonstrate this, but was felt to be consistent with symptoms.  Well-controlled now that she is on amlodipine.  No longer on Imdur because of headache.  Plan: We will convert beta-blocker to carvedilol for better blood pressure control given recent hypertension/TIA episode.

## 2019-09-24 NOTE — Assessment & Plan Note (Signed)
Minimal CAD noted on cardiac cath. Presumably, her PCP is following her lipids.  Last check I saw was about 2 years ago with an LDL of 114.  Would probably want to have LDL less than 100.  Defer evaluation to PCP for now, but if not checked by follow-up we will recheck.

## 2019-09-24 NOTE — Assessment & Plan Note (Signed)
She says she had a complete work-up at the [facility], but I do not have any reports.  I do not want to order any studies on her at this point.  She has had a relatively benign echocardiogram done recently.  She never had any sensation of any irregular heartbeats palpitations some not sure how beneficial a monitor would be, but however if there is recurrence then we would talk about potential loop recorder.  Could consider carotid Dopplers if not done in [place]. --> Will forward note to our clinical pharmacist.  If this was not done, we can order carotid Dopplers prior to follow-up.

## 2019-09-24 NOTE — Assessment & Plan Note (Signed)
Overall is doing fairly well.  I suspect if he get blood pressure under control and lose some weight, it would improve.

## 2019-09-29 ENCOUNTER — Telehealth: Payer: Self-pay | Admitting: Cardiology

## 2019-09-29 ENCOUNTER — Telehealth: Payer: Self-pay | Admitting: *Deleted

## 2019-09-29 MED ORDER — METOPROLOL TARTRATE 50 MG PO TABS: 50.0000 mg | ORAL_TABLET | Freq: Two times a day (BID) | ORAL | 3 refills | Status: DC

## 2019-09-29 NOTE — Telephone Encounter (Signed)
Called and pt phone went straight to VM. Vm full.

## 2019-09-29 NOTE — Telephone Encounter (Signed)
Patient requesting work excuse for medication reaction.  I wanted to get in trouble at work so we can set it is okay for her to return to work tonight and having been out due to medication reaction.  Glenetta Hew, MD

## 2019-09-29 NOTE — Telephone Encounter (Signed)
Open error  See other note from 09/29/2019

## 2019-09-29 NOTE — Telephone Encounter (Signed)
Patient returning call in regards to her mychart message.

## 2019-09-29 NOTE — Telephone Encounter (Signed)
Called patient as request by Dr Ellyn Hack. Patient states she needs a  note for Jan 28,2021. She callled out due to having some issues with new medication started .   Patient states she stopped taking the carvedilol and spironolactone  -she began to itch.   RN discussed with Dr Ellyn Hack, Patient states blood pressure  has been 171/106 today and earlier today  180/112  With a headache.  Per Dr Ellyn Hack restart Metoprolol  50 mg twice a day.   patient voiced understanding.  Note Sent by [employer]

## 2019-09-29 NOTE — Telephone Encounter (Signed)
Pt c/o medication issue:  1. Name of Medication: carvedilol (COREG) 12.5 MG tablet / spironolactone (ALDACTONE) 25 MG tablet  2. How are you currently taking this medication (dosage and times per day)? Not anymore  3. Are you having a reaction (difficulty breathing--STAT)? no  4. What is your medication issue? Patient sent a mychart messag on 09/26/19 about having an allergic reaction to this medication. She was given recommendations on what to do and has stopped taking this medication. Now, her BP is high. The lowest it has been is 171/106. She states she has headaches and when she wakes up she is a little dizzy. Denies any other symptoms.

## 2019-10-01 NOTE — Telephone Encounter (Signed)
Patient was spoken to by Dr.Hardings nurse.  Will complete call and take out of triage.

## 2019-10-05 NOTE — Telephone Encounter (Signed)
My Chart: I think we need to have you come in to see my clinical pharmacists to try to adjust medications for BP. Unfortunately, you had a rash reaction to one of the medications that we changed to.  These BP levels are not dangerous - but we need to improve them for long term benefit.  Glenetta Hew, MD ===View-only below this line===   ----- Message -----    From: Tammie Powell    Sent: 09/29/2019 10:48 AM EST      To: Glenetta Hew, MD Subject: Non-Urgent Medical Question  I contacted the office last wk regarding allergic reaction to new meds . I was broke out and itching all over I received instructions on what to do . I advised that I couldn't work due to the reaction and requested an excuse for work I never heard anything back . My job is planning on writing me up without and excuse . Can you please email me the excuse today ? I report to work tonight at [employer]

## 2019-10-08 NOTE — Telephone Encounter (Signed)
MS Hoben , I spoke with Dr Ellyn Hack he would like for move your appointment up.  I have an appointment for you to see our pharmacist tomorrow at 10 am , please bring  Blood pressure cuff to see if the reading is the same with our cuff in the office. He would like for take an extra 50 mg Metoprolol now then increase to 100 mg  Metoprolol  Twice a day until your appointment   medication was not changed in on medication list   sent this message by [employer]

## 2019-10-09 ENCOUNTER — Encounter: Payer: Self-pay | Admitting: Pharmacist

## 2019-10-09 ENCOUNTER — Ambulatory Visit (INDEPENDENT_AMBULATORY_CARE_PROVIDER_SITE_OTHER): Payer: BC Managed Care – PPO | Admitting: Pharmacist

## 2019-10-09 ENCOUNTER — Other Ambulatory Visit: Payer: Self-pay

## 2019-10-09 VITALS — BP 150/90 | HR 74 | Resp 15 | Ht 65.0 in

## 2019-10-09 DIAGNOSIS — I10 Essential (primary) hypertension: Secondary | ICD-10-CM

## 2019-10-09 MED ORDER — LISINOPRIL-HYDROCHLOROTHIAZIDE 20-12.5 MG PO TABS: 2.0000 | ORAL_TABLET | Freq: Every day | ORAL | 0 refills | Status: DC

## 2019-10-09 NOTE — Progress Notes (Signed)
Patient ID: Tammie Powell                 DOB: 1970/08/29                      MRN: [id number]     HPI: Miylah Kasza Eduardo is a 49 y.o. female referred by Dr. Ellyn Hack to HTN clinic. PMH includes uncontrolled hypertension, hypothyroidism, TIA, and coronary artery spasms. She reports to HTN for initial evaluation and medication titration. Noted allergic reaction to spironolactone. Patient reports persist headaches and neck pain. 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 She is currently awaiting f/u with neurologist but persistent with potential tension headaches covering upper back and next spasms.   Current HTN meds:  Amlodipine 10mg  daily Lisinopril 20mg /25mg  daily -  Metoprolol 100mg    Previously tried:  Spironolactone - rash  BP goal: <130/80  Family History:   Social History: denies tobacco and alcohol  Diet: avoids salt, avoids sodas, eat mainly chicken and [place] , avoid eat out  Exercise: stationary bike 3 times weekly  Home BP readings: no records provided Equate arm wrist: accura within 11mmHg withR 146/96 (79); 142/94 L 141/106 (81); 148/104    Wt Readings from Last 3 Encounters:  09/19/19 249 lb 12.8 oz (113.3 kg)  05/26/19 230 lb (104.3 kg)  04/24/19 230 lb (104.3 kg)   BP Readings from Last 3 Encounters:  10/09/19 (!) 150/90  09/19/19 (!) 151/100  05/26/19 126/80   Pulse Readings from Last 3 Encounters:  10/09/19 74  09/19/19 75  05/26/19 80    Past Medical History:  Diagnosis Date  . Complication of anesthesia   . GERD (gastroesophageal reflux disease)   . Hypertension   . Hypothyroidism   . PONV (postoperative nausea and vomiting)   . Thyroid disease     Current Outpatient Medications on File Prior to Visit  Medication Sig Dispense Refill  . albuterol (VENTOLIN HFA) 108 (90 Base) MCG/ACT inhaler Inhale 1-2 puffs into the lungs every 6 (six) hours as needed for wheezing or shortness of breath.      Marland Kitchen amLODipine (NORVASC) 10 MG tablet Take 1 tablet (10 mg total) by mouth daily. 90 tablet 4  . budesonide-formoterol (SYMBICORT) 80-4.5 MCG/ACT inhaler Inhale 2 puffs into the lungs 2 (two) times daily.    Marland Kitchen levothyroxine (EUTHYROX) 175 MCG tablet Take 175 mcg by mouth daily before breakfast.    . metoprolol tartrate (LOPRESSOR) 50 MG tablet Take 1 tablet (50 mg total) by mouth 2 (two) times daily. 180 tablet 3  . valACYclovir (VALTREX) 500 MG tablet Take 2,000 mg by mouth daily as needed (for fever blisters).     Marland Kitchen HYDROmorphone (DILAUDID) 2 MG tablet Take 1 tablet (2 mg total) by mouth every 4 (four) hours as needed for severe pain. (Patient not taking: Reported on 10/09/2019) 20 tablet 0  . lidocaine (LIDODERM) 5 % Place 1 patch onto the skin daily. Remove & Discard patch within 12 hours or as directed by MD (Patient not taking: Reported on 10/09/2019) 30 patch 0  . traMADol (ULTRAM) 50 MG tablet Take 1 tablet (50 mg total) by mouth every 8 (eight) hours as needed. (Patient not taking: Reported on 10/09/2019) 30 tablet 0   No current facility-administered medications on file prior to visit.    Allergies  Allergen Reactions  . Iodinated Diagnostic Agents Itching  . Codeine Hives, Itching and Other (See  Comments)    burning  . Gadolinium Derivatives Hives and Other (See Comments)    Patient had a few hives that were mildly itchy. Checked by dr Carlis Abbott, since severity was so low, and patient had no driver, he recommended she take benadryl at home. She remained in office for 40 minutes post injection to be observed.   . Tape Swelling and Rash    Blood pressure (!) 150/90, pulse 74, resp. rate 15, height 5\' 5"  (1.651 m), last menstrual period 08/08/2018, SpO2 98 %.  Essential hypertension BP remains above goal and unable to tolerate spironolactone therapy as precrtibed by cardiologits. Carvedilol was also presibed at the same time and is unclear if patient is allergic to carvedilol as well;  therefore, metoprolol dose was increased to 100mg  daily during last call with Dr Ellyn Hack.  Will increase lisinopril/HCT to 40mg /25mg  daily, work on positive lifestyle modifications, and follow up in 3 weeks. Patient should follow up with neurologist in 2 weeks for further assessment of headaches. Massage therapy and relaxation exercises were recommended during visit to allow some relieve while waiting for her appointment. Plan to repeat BMET in 2 weeks, and try hydralazine 50mg  BID if BP continues to have severe peaks during the day.   Shayanne Gomm Rodriguez-Guzman PharmD, BCPS, [place] [id number] 10/16/2019 10:39 AM

## 2019-10-09 NOTE — Patient Instructions (Addendum)
Return for a  follow up appointment in 4 weeks  Go to the lab in 2 weeks  Check your blood pressure at home daily (if able) and keep record of the readings.  Take your BP meds as follows: *INCREASE LISINOPRIL/HCT TO 40/25MG  DAILY ( 2 TABLETS OF LISINOPRIL 20/12.5 DAILY)  Bring all of your meds, your BP cuff and your record of home blood pressures to your next appointment.  Exercise as you're able, try to walk approximately 30 minutes per day.  Keep salt intake to a minimum, especially watch canned and prepared boxed foods.  Eat more fresh fruits and vegetables and fewer canned items.  Avoid eating in fast food restaurants.    HOW TO TAKE YOUR BLOOD PRESSURE: . Rest 5 minutes before taking your blood pressure. .  Don't smoke or drink caffeinated beverages for at least 30 minutes before. . Take your blood pressure before (not after) you eat. . Sit comfortably with your back supported and both feet on the floor (don't cross your legs). . Elevate your arm to heart level on a table or a desk. . Use the proper sized cuff. It should fit smoothly and snugly around your bare upper arm. There should be enough room to slip a fingertip under the cuff. The bottom edge of the cuff should be 1 inch above the crease of the elbow. . Ideally, take 3 measurements at one sitting and record the average.

## 2019-10-14 ENCOUNTER — Telehealth: Payer: Self-pay | Admitting: Pharmacist

## 2019-10-14 MED ORDER — HYDRALAZINE HCL 25 MG PO TABS: 25.0000 mg | ORAL_TABLET | Freq: Three times a day (TID) | ORAL | 1 refills | Status: DC | PRN

## 2019-10-14 NOTE — Telephone Encounter (Signed)
Elevated BP while on lisinopril, HCTZ, metoprolol and amlodipine

## 2019-10-16 ENCOUNTER — Encounter: Payer: Self-pay | Admitting: Pharmacist

## 2019-10-16 NOTE — Assessment & Plan Note (Addendum)
BP remains above goal and unable to tolerate spironolactone therapy as precrtibed by cardiologits. Carvedilol was also presibed at the same time and is unclear if patient is allergic to carvedilol as well; therefore, metoprolol dose was increased to 100mg  daily during last call with Dr Ellyn Hack.  Will increase lisinopril/HCT to 40mg /25mg  daily, work on positive lifestyle modifications, and follow up in 3 weeks. Patient should follow up with neurologist in 2 weeks for further assessment of headaches. Massage therapy and relaxation exercises were recommended during visit to allow some relieve while waiting for her appointment. Plan to repeat BMET in 2 weeks, and try hydralazine 50mg  BID if BP continues to have severe peaks during the day.

## 2019-11-06 ENCOUNTER — Ambulatory Visit: Payer: BC Managed Care – PPO

## 2019-11-06 NOTE — Progress Notes (Deleted)
Patient ID: Tammie Powell                 DOB: 1970-11-15                      MRN: [id number]     HPI: Tammie Powell is a 49 y.o. female referred by Dr. Ellyn Hack to HTN clinic. PMH includes uncontrolled hypertension, hypothyroidism, TIA, and coronary artery spasms. She reports to HTN for initial evaluation and medication titration. Noted allergic reaction to spironolactone. Patient reports persist headaches and neck pain. 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 She is currently awaiting f/u with neurologist but persistent with potential tension headaches covering upper back and next spasms.   Current HTN meds:  Amlodipine 10mg  daily Lisinopril 40mg /25mg  daily -  Metoprolol 100mg    Previously tried:  Spironolactone - rash  BP goal: <130/80  Family History:   Social History: denies tobacco and alcohol  Diet: avoids salt, avoids sodas, eat mainly chicken and [place] , avoid eat out  Exercise: stationary bike 3 times weekly  Home BP readings: no records provided Equate arm wrist: accura within 42mmHg withR 146/96 (79); 142/94 L 141/106 (81); 148/104    Wt Readings from Last 3 Encounters:  09/19/19 249 lb 12.8 oz (113.3 kg)  05/26/19 230 lb (104.3 kg)  04/24/19 230 lb (104.3 kg)   BP Readings from Last 3 Encounters:  10/09/19 (!) 150/90  09/19/19 (!) 151/100  05/26/19 126/80   Pulse Readings from Last 3 Encounters:  10/09/19 74  09/19/19 75  05/26/19 80    Past Medical History:  Diagnosis Date  . Complication of anesthesia   . GERD (gastroesophageal reflux disease)   . Hypertension   . Hypothyroidism   . PONV (postoperative nausea and vomiting)   . Thyroid disease     Current Outpatient Medications on File Prior to Visit  Medication Sig Dispense Refill  . albuterol (VENTOLIN HFA) 108 (90 Base) MCG/ACT inhaler Inhale 1-2 puffs into the lungs every 6 (six) hours as needed for wheezing or shortness of breath.      Marland Kitchen amLODipine (NORVASC) 10 MG tablet Take 1 tablet (10 mg total) by mouth daily. 90 tablet 4  . budesonide-formoterol (SYMBICORT) 80-4.5 MCG/ACT inhaler Inhale 2 puffs into the lungs 2 (two) times daily.    . hydrALAZINE (APRESOLINE) 25 MG tablet Take 1 tablet (25 mg total) by mouth 3 (three) times daily as needed (for systolic blood pressure above 180). 90 tablet 1  . HYDROmorphone (DILAUDID) 2 MG tablet Take 1 tablet (2 mg total) by mouth every 4 (four) hours as needed for severe pain. (Patient not taking: Reported on 10/09/2019) 20 tablet 0  . levothyroxine (EUTHYROX) 175 MCG tablet Take 175 mcg by mouth daily before breakfast.    . lidocaine (LIDODERM) 5 % Place 1 patch onto the skin daily. Remove & Discard patch within 12 hours or as directed by MD (Patient not taking: Reported on 10/09/2019) 30 patch 0  . lisinopril-hydrochlorothiazide (ZESTORETIC) 20-12.5 MG tablet Take 2 tablets by mouth daily. 60 tablet 0  . metoprolol tartrate (LOPRESSOR) 50 MG tablet Take 1 tablet (50 mg total) by mouth 2 (two) times daily. 180 tablet 3  . traMADol (ULTRAM) 50 MG tablet Take 1 tablet (50 mg total) by mouth every 8 (eight) hours as needed. (Patient not taking: Reported on 10/09/2019) 30 tablet 0  . valACYclovir (VALTREX) 500 MG tablet  Take 2,000 mg by mouth daily as needed (for fever blisters).      No current facility-administered medications on file prior to visit.    Allergies  Allergen Reactions  . Iodinated Diagnostic Agents Itching  . Codeine Hives, Itching and Other (See Comments)    burning  . Gadolinium Derivatives Hives and Other (See Comments)    Patient had a few hives that were mildly itchy. Checked by dr Carlis Abbott, since severity was so low, and patient had no driver, he recommended she take benadryl at home. She remained in office for 40 minutes post injection to be observed.   . Tape Swelling and Rash    Last menstrual period 08/08/2018.  No problem-specific Assessment & Plan notes found  for this encounter.   Raquel Rodriguez-Guzman PharmD, BCPS, CPP [place] [id number] 11/06/2019 10:42 AM

## 2020-04-07 ENCOUNTER — Telehealth: Payer: Self-pay | Admitting: Cardiology

## 2020-04-07 NOTE — Telephone Encounter (Signed)
Attempted to contact patient to schedule follow up but the number has been disconnected

## 2020-05-27 ENCOUNTER — Telehealth: Payer: Self-pay | Admitting: Cardiology

## 2020-05-27 NOTE — Telephone Encounter (Signed)
Attempted to contact patient, no answer and unable to lvm

## 2020-06-13 ENCOUNTER — Emergency Department (HOSPITAL_COMMUNITY): Payer: BC Managed Care – PPO

## 2020-06-13 ENCOUNTER — Emergency Department (HOSPITAL_COMMUNITY)
Admission: EM | Admit: 2020-06-13 | Discharge: 2020-06-13 | Disposition: A | Discharge status: Home / Self Care | Payer: BC Managed Care – PPO | Attending: Emergency Medicine | Admitting: Emergency Medicine

## 2020-06-13 ENCOUNTER — Other Ambulatory Visit: Payer: Self-pay

## 2020-06-13 ENCOUNTER — Encounter (HOSPITAL_COMMUNITY): Payer: Self-pay | Admitting: Emergency Medicine

## 2020-06-13 DIAGNOSIS — Z79899 Other long term (current) drug therapy: Secondary | ICD-10-CM | POA: Insufficient documentation

## 2020-06-13 DIAGNOSIS — E039 Hypothyroidism, unspecified: Secondary | ICD-10-CM | POA: Diagnosis not present

## 2020-06-13 DIAGNOSIS — K219 Gastro-esophageal reflux disease without esophagitis: Secondary | ICD-10-CM | POA: Insufficient documentation

## 2020-06-13 DIAGNOSIS — R1031 Right lower quadrant pain: Secondary | ICD-10-CM

## 2020-06-13 DIAGNOSIS — N83201 Unspecified ovarian cyst, right side: Secondary | ICD-10-CM | POA: Diagnosis not present

## 2020-06-13 DIAGNOSIS — Z8719 Personal history of other diseases of the digestive system: Secondary | ICD-10-CM | POA: Diagnosis not present

## 2020-06-13 DIAGNOSIS — I10 Essential (primary) hypertension: Secondary | ICD-10-CM | POA: Insufficient documentation

## 2020-06-13 LAB — CBC
HCT: 42.6 % (ref 36.0–46.0)
Hemoglobin: 13.7 g/dL (ref 12.0–15.0)
MCH: 30.7 pg (ref 26.0–34.0)
MCHC: 32.2 g/dL (ref 30.0–36.0)
MCV: 95.5 fL (ref 80.0–100.0)
Platelets: 205 10*3/uL (ref 150–400)
RBC: 4.46 MIL/uL (ref 3.87–5.11)
RDW: 12.1 % (ref 11.5–15.5)
WBC: 6.7 10*3/uL (ref 4.0–10.5)
nRBC: 0 % (ref 0.0–0.2)

## 2020-06-13 LAB — URINALYSIS, ROUTINE W REFLEX MICROSCOPIC
Bilirubin Urine: NEGATIVE
Glucose, UA: NEGATIVE mg/dL
Hgb urine dipstick: NEGATIVE
Ketones, ur: NEGATIVE mg/dL
Leukocytes,Ua: NEGATIVE
Nitrite: NEGATIVE
Protein, ur: NEGATIVE mg/dL
Specific Gravity, Urine: 1.025 (ref 1.005–1.030)
pH: 6 (ref 5.0–8.0)

## 2020-06-13 LAB — COMPREHENSIVE METABOLIC PANEL
ALT: 47 U/L — ABNORMAL HIGH (ref 0–44)
AST: 28 U/L (ref 15–41)
Albumin: 3.7 g/dL (ref 3.5–5.0)
Alkaline Phosphatase: 99 U/L (ref 38–126)
Anion gap: 8 (ref 5–15)
BUN: 16 mg/dL (ref 6–20)
CO2: 26 mmol/L (ref 22–32)
Calcium: 8.8 mg/dL — ABNORMAL LOW (ref 8.9–10.3)
Chloride: 104 mmol/L (ref 98–111)
Creatinine, Ser: 0.81 mg/dL (ref 0.44–1.00)
GFR, Estimated: >60 mL/min (ref >60)
Glucose, Bld: 111 mg/dL — ABNORMAL HIGH (ref 70–99)
Potassium: 3.8 mmol/L (ref 3.5–5.1)
Sodium: 138 mmol/L (ref 135–145)
Total Bilirubin: 0.8 mg/dL (ref 0.3–1.2)
Total Protein: 6.5 g/dL (ref 6.5–8.1)

## 2020-06-13 LAB — I-STAT BETA HCG BLOOD, ED (MC, WL, AP ONLY): I-stat hCG, quantitative: <5 m[IU]/mL (ref <5)

## 2020-06-13 LAB — WET PREP, GENITAL
Clue Cells Wet Prep HPF POC: NONE SEEN
Sperm: NONE SEEN
Trich, Wet Prep: NONE SEEN
Yeast Wet Prep HPF POC: NONE SEEN

## 2020-06-13 LAB — LIPASE, BLOOD: Lipase: 41 U/L (ref 11–51)

## 2020-06-13 MED ORDER — MORPHINE SULFATE (PF) 4 MG/ML IV SOLN: 4.0000 mg | Freq: Once | INTRAVENOUS | Status: DC

## 2020-06-13 MED ORDER — DIPHENHYDRAMINE HCL 50 MG/ML IJ SOLN: 25.0000 mg | Freq: Once | INTRAMUSCULAR | Status: DC

## 2020-06-13 MED ORDER — FENTANYL CITRATE (PF) 100 MCG/2ML IJ SOLN
50.0000 ug | INTRAMUSCULAR | Status: DC | PRN
  Administered 2020-06-13: 50 ug via INTRAVENOUS
  Filled 2020-06-13: qty 2

## 2020-06-13 MED ORDER — SODIUM CHLORIDE 0.9 % IV BOLUS
1000.0000 mL | Freq: Once | INTRAVENOUS | Status: AC
  Administered 2020-06-13: 1000 mL via INTRAVENOUS

## 2020-06-13 MED ORDER — BARIUM SULFATE 2.1 % PO SUSP
ORAL | Status: AC
  Filled 2020-06-13: qty 2

## 2020-06-13 MED ORDER — ONDANSETRON 4 MG PO TBDP: 4.0000 mg | ORAL_TABLET | Freq: Three times a day (TID) | ORAL | 0 refills | Status: DC | PRN

## 2020-06-13 MED ORDER — ONDANSETRON HCL 4 MG/2ML IJ SOLN
4.0000 mg | Freq: Once | INTRAMUSCULAR | Status: AC
  Administered 2020-06-13: 4 mg via INTRAVENOUS
  Filled 2020-06-13: qty 2

## 2020-06-13 NOTE — ED Provider Notes (Signed)
[facility] EMERGENCY DEPARTMENT Provider Note   CSN: [id number] Arrival date & time: 06/13/20  1884     History Chief Complaint  Patient presents with  . Abdominal Pain    Tammie Powell is a 49 y.o. female.  HPI  Patient is a 49 year old female with a past medical history of reflux, HTN, hypothyroidism  She is presented today for right lower quadrant Donnell pain that is achy and severe, associated with nausea all the symptoms started at approximately 2:30 AM this morning when she woke up because of the abdominal pain.  She denies any vomiting, diarrhea, pelvic pain, vaginal pain, discharge, abnormal bowel movements.  No diarrhea or constipation.  She denies any chest pain or shortness of breath.  She states she takes her medications as prescribed.  She has taken no medications prior to arrival.  No other associated symptoms.  No aggravating or mitigating factors.  She states she has no appetite.     Past Medical History:  Diagnosis Date  . Complication of anesthesia   . GERD (gastroesophageal reflux disease)   . Hypertension   . Hypothyroidism   . PONV (postoperative nausea and vomiting)   . Thyroid disease     Patient Active Problem List   Diagnosis Date Noted  . TIA (transient ischemic attack) 09/24/2019  . Leiomyoma of uterus, unspecified 08/09/2018  . Cervical intraepithelial neoplasia grade III with severe dysplasia 08/09/2018  . 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 Progressive angina ([place]) 10/17/2017  . DOE (dyspnea on exertion) 09/19/2017  . Family history of premature coronary artery disease 06/12/2017  . Chest pain with moderate risk for cardiac etiology - Persistent Sx despite Negative Ischemic Evaluation -- > and normal cath 06/12/2017  . Essential hypertension 06/12/2017  . Obesity (BMI 30.0-34.9) 06/12/2017  . Racing heart beat 06/12/2017    Past Surgical History:  Procedure Laterality Date  . CERVICAL CONE BIOPSY    .  CERVICAL CONIZATION W/BX N/A 06/28/2018   Procedure: CONIZATION CERVIX WITH BIOPSY;  Surgeon: Ena Dawley, MD;  Location: [facility];  Service: Gynecology;  Laterality: N/A;  . CHOLECYSTECTOMY    . RIGHT/LEFT HEART CATH AND CORONARY ANGIOGRAPHY N/A 10/17/2017   Procedure: RIGHT/LEFT HEART CATH AND CORONARY ANGIOGRAPHY;  Surgeon: Leonie Man, MD;  Location: [facility];  Service: Cardiovascular;:: Normal LVEDP.  Possibly 2+ MR with normal systolic function.  Normal right heart pressures. -->  Increased amlodipine to 10 mg daily  . TRANSTHORACIC ECHOCARDIOGRAM  02/'19; 9/'20   EF 60-65%.  No RWMA.  Normal PA pressures.  Normal valves.  Essentially normal echo;; 9/'20:  EF 60 to 65%.  Normal wall motion.  Mild left atrial dilation suggestive of mild diastolic dysfunction (however parameters were normal.  Normal RV size and function.  . TUBAL LIGATION    . VAGINAL HYSTERECTOMY Right 08/09/2018   Procedure: HYSTERECTOMY VAGINAL WITH RIGHT SALPINGECTOMY;  Surgeon: Ena Dawley, MD;  Location: [facility];  Service: Gynecology;  Laterality: Right;     OB History   No obstetric history on file.     Family History  Problem Relation Age of Onset  . Stroke Mother   . Hypertension Mother   . Hypothyroidism Mother   . Seizures Maternal Aunt   . Aneurysm Maternal Aunt   . Heart attack Father 91  . CAD Father        stents  . Hypothyroidism Sister   . Hypertension Maternal Grandmother   .  Arthritis Maternal Grandmother   . Hypothyroidism Sister     Social History   Tobacco Use  . Smoking status: Never Smoker  . Smokeless tobacco: Never Used  Vaping Use  . Vaping Use: Never used  Substance Use Topics  . Alcohol use: Yes    Comment: occ wine  . Drug use: No    Home Medications Prior to Admission medications   Medication Sig Start Date End Date Taking? Authorizing Provider  albuterol (VENTOLIN HFA) 108 (90 Base) MCG/ACT inhaler Inhale 1-2 puffs into the  lungs every 6 (six) hours as needed for wheezing or shortness of breath.  01/21/19   [provider]  amLODipine (NORVASC) 10 MG tablet Take 1 tablet (10 mg total) by mouth daily. 10/18/17   Leonie Man, MD  budesonide-formoterol [facility]) 80-4.5 MCG/ACT inhaler Inhale 2 puffs into the lungs 2 (two) times daily.    [provider]  hydrALAZINE (APRESOLINE) 25 MG tablet Take 1 tablet (25 mg total) by mouth 3 (three) times daily as needed (for systolic blood pressure above 180). 10/14/19   Leonie Man, MD  HYDROmorphone (DILAUDID) 2 MG tablet Take 1 tablet (2 mg total) by mouth every 4 (four) hours as needed for severe pain. Patient not taking: Reported on 10/09/2019 08/10/18   Ena Dawley, MD  levothyroxine (EUTHYROX) 175 MCG tablet Take 175 mcg by mouth daily before breakfast.    [provider]  lidocaine (LIDODERM) 5 % Place 1 patch onto the skin daily. Remove & Discard patch within 12 hours or as directed by MD Patient not taking: Reported on 10/09/2019 03/11/19   Rodell Perna A, PA-C  lisinopril-hydrochlorothiazide (ZESTORETIC) 20-12.5 MG tablet Take 2 tablets by mouth daily. 10/09/19   Leonie Man, MD  metoprolol tartrate (LOPRESSOR) 50 MG tablet Take 1 tablet (50 mg total) by mouth 2 (two) times daily. 09/29/19 12/28/19  Leonie Man, MD  ondansetron (ZOFRAN ODT) 4 MG disintegrating tablet Take 1 tablet (4 mg total) by mouth every 8 (eight) hours as needed for nausea or vomiting. 06/13/20   Tedd Sias, PA  traMADol (ULTRAM) 50 MG tablet Take 1 tablet (50 mg total) by mouth every 8 (eight) hours as needed. Patient not taking: Reported on 10/09/2019 05/26/19   Lendon Colonel, NP  valACYclovir (VALTREX) 500 MG tablet Take 2,000 mg by mouth daily as needed (for fever blisters).     [provider]    Allergies    Iodinated diagnostic agents, Codeine, Gadolinium derivatives, and Tape  Review of Systems   Review of Systems    Constitutional: Positive for appetite change. Negative for chills and fever.  HENT: Negative for congestion.   Eyes: Negative for pain.  Respiratory: Negative for cough and shortness of breath.   Cardiovascular: Negative for chest pain and leg swelling.  Gastrointestinal: Positive for abdominal pain and nausea. Negative for constipation, diarrhea and vomiting.  Genitourinary: Negative for dysuria.  Musculoskeletal: Negative for myalgias.  Skin: Negative for rash.  Neurological: Negative for dizziness and headaches.    Physical Exam Updated Vital Signs BP 140/85 (BP Location: Left Arm)   Pulse 66   Temp 97.8 F (36.6 C)   Resp 20   Ht 5\' 5"  (1.651 m)   Wt 97.5 kg   LMP 08/08/2018   SpO2 100%   BMI 35.78 kg/m   Physical Exam Vitals and nursing note reviewed.  Constitutional:      General: She is not in acute distress.  Comments: Pleasant well-appearing 49 year old.  In some discomfort.  Sitting in bed.  Able answer questions appropriately follow commands. No increased work of breathing. Speaking in full sentences.  HENT:     Head: Normocephalic and atraumatic.     Nose: Nose normal.  Eyes:     General: No scleral icterus. Cardiovascular:     Rate and Rhythm: Normal rate and regular rhythm.     Pulses: Normal pulses.     Heart sounds: Normal heart sounds.  Pulmonary:     Effort: Pulmonary effort is normal. No respiratory distress.     Breath sounds: No wheezing.  Abdominal:     Palpations: Abdomen is soft.     Tenderness: There is abdominal tenderness in the right lower quadrant. There is no right CVA tenderness or left CVA tenderness. Positive signs include McBurney's sign. Negative signs include Murphy's sign and obturator sign.  Genitourinary:    Comments: Vulva without lesions or abnormality Vaginal canal without abnormal discharge or lesion Cervix appears normal, is closed No adnexal tenderness or CMT Musculoskeletal:     Cervical back: Normal range of  motion.     Right lower leg: No edema.     Left lower leg: No edema.  Skin:    General: Skin is warm and dry.     Capillary Refill: Capillary refill takes less than 2 seconds.  Neurological:     Mental Status: She is alert. Mental status is at baseline.  Psychiatric:        Mood and Affect: Mood normal.        Behavior: Behavior normal.     ED Results / Procedures / Treatments   Labs (all labs ordered are listed, but only abnormal results are displayed) Labs Reviewed  WET PREP, GENITAL - Abnormal; Notable for the following components:      Result Value   WBC, Wet Prep HPF POC MANY (*)    All other components within normal limits  COMPREHENSIVE METABOLIC PANEL - Abnormal; Notable for the following components:   Glucose, Bld 111 (*)    Calcium 8.8 (*)    ALT 47 (*)    All other components within normal limits  LIPASE, BLOOD  CBC  URINALYSIS, ROUTINE W REFLEX MICROSCOPIC  I-STAT BETA HCG BLOOD, ED ([facility], [facility], [facility] ONLY)  GC/CHLAMYDIA PROBE AMP ([place]) NOT AT [facility]    EKG None  Radiology CT ABDOMEN PELVIS WO CONTRAST  Result Date: 06/13/2020 CLINICAL DATA:  Lower quadrant pain.  Appendicitis suspected. EXAM: CT ABDOMEN AND PELVIS WITHOUT CONTRAST TECHNIQUE: Multidetector CT imaging of the abdomen and pelvis was performed following the standard protocol without IV contrast. COMPARISON:  None. FINDINGS: Lower chest: The lower chest/lung bases are unremarkable with no significant abnormalities. Hepatobiliary: No focal liver abnormality is seen. Status post cholecystectomy. No biliary dilatation. Pancreas: Unremarkable. No pancreatic ductal dilatation or surrounding inflammatory changes. Spleen: Normal in size without focal abnormality. Adrenals/Urinary Tract: Adrenal glands are unremarkable. Kidneys are normal, without renal calculi, focal lesion, or hydronephrosis. Bladder is unremarkable. Stomach/Bowel: The stomach and small bowel are normal. Mild fecal loading in the ascending  colon is identified. The colon is otherwise normal. The appendix is not definitively identified. There is no secondary evidence of appendicitis. Vascular/Lymphatic: No significant vascular findings are present. No enlarged abdominal or pelvic lymph nodes. Reproductive: Patient is status post hysterectomy. The left ovary is normal in appearance. There is a cyst or follicle in the right ovary measuring 2.8 cm. Other: No abdominal wall  hernia or abnormality. No abdominopelvic ascites. Musculoskeletal: No acute or significant osseous findings. IMPRESSION: 1. The appendix is not definitively visualized. There is no secondary evidence of appendicitis. 2. There is a dominant cyst/follicle in the right ovary measuring 2.8 cm in size. It is possible this is the source of the patient's pain. 3. Mild fecal loading in the ascending colon. Electronically Signed   By: Dorise Bullion III M.D   On: 06/13/2020 12:10    Procedures Procedures (including critical care time)  Medications Ordered in ED Medications  fentaNYL (SUBLIMAZE) injection 50 mcg (50 mcg Intravenous Given 06/13/20 1020)  Barium Sulfate 2.1 % SUSP (has no administration in time range)  ondansetron (ZOFRAN) injection 4 mg (4 mg Intravenous Given 06/13/20 1018)  sodium chloride 0.9 % bolus 1,000 mL (1,000 mLs Intravenous New Bag/Given 06/13/20 1017)    ED Course  I have reviewed the triage vital signs and the nursing notes.  Pertinent labs & imaging results that were available during my care of the patient were reviewed by me and considered in my medical decision making (see chart for details).  Patient for lower quadrant pain that began this morning around 3 AM.  CMP without any electrolyte abnormalities.  CBC without leukocytosis or anemia.  Lipase within normal limits.  I-STAT hCG negative for pregnancy.  Urinalysis unremarkable.  Wet prep and GC chlamydia probe obtained as part of pelvic exam however are noncontributory to my evaluation.  She  has no cervical motion tenderness doubt pelvic inflammatory disease torsion.  She does have a adnexal cyst however very low suspicion for this causing the patient's pain.  Clinical Course as of Jun 13 1399  Sun Jun 13, 2020  0951 Discussed with CT technician who will be patient's oral contrast.  I informed patient plan.   [WF]  1240 IMPRESSION: 1. The appendix is not definitively visualized. There is no secondary evidence of appendicitis. 2. There is a dominant cyst/follicle in the right ovary measuring 2.8 cm in size. It is possible this is the source of the patient's pain. 3. Mild fecal loading in the ascending colon.    [WF]    Clinical Course User Index [WF] Tedd Sias, PA   Discussed with general surgery MD who recommend PCP follow up in 24-48 hours. Pt has an appointment on Tuesday with her primary care doctor.  We will have her discuss with PCP for symptoms.  She was given strict return precautions.  Reexamination patient continues to have right lower quadrant tenderness.  She states her pain is very much improved with medicine and she is comfortable going home with follow-up with PCP.  MDM Rules/Calculators/A&P                          Patient p.o. challenge is tolerating p.o.  Discharged with Zofran and services OTC pain medicine.  She understand strict return precautions.  Final Clinical Impression(s) / ED Diagnoses Final diagnoses:  Right lower quadrant abdominal pain  Cyst of right ovary    Rx / DC Orders ED Discharge Orders         Ordered    ondansetron (ZOFRAN ODT) 4 MG disintegrating tablet  Every 8 hours PRN        06/13/20 1400           Pati Gallo [place], [place] 06/13/20 1400    Charlesetta Shanks, MD 06/14/20 1356

## 2020-06-13 NOTE — ED Triage Notes (Signed)
C/o RLQ pain that woke her up at 2:30am with nausea.  Denies vomiting, diarrhea, and urinary complaints.

## 2020-06-13 NOTE — ED Notes (Signed)
Pt transported to CT ?

## 2020-06-13 NOTE — ED Notes (Addendum)
Patient verbalizes understanding of discharge instructions. Opportunity for questioning and answers were provided. Armband removed by staff, pt discharged from ED to home via POV. A&Ox4, ambulatory 

## 2020-06-13 NOTE — Discharge Instructions (Signed)
You have a right-sided ovarian cyst which may be causing his symptoms today however I do have some concern for an appendicitis I would like for you to follow-up with your primary care doctor either Monday or Tuesday to have your symptoms reevaluated.  You may also return the emergency department for any new or concerning symptoms.  Order symptoms are worsening or you develop a fever.  Please use Tylenol or ibuprofen for pain.  You may use 600 mg ibuprofen every 6 hours or 1000 mg of Tylenol every 6 hours.  You may choose to alternate between the 2.  This would be most effective.  Not to exceed 4 g of Tylenol within 24 hours.  Not to exceed 3200 mg ibuprofen 24 hours.  I have also  prescribed you Zofran in case you have any nausea.

## 2020-06-14 LAB — GC/CHLAMYDIA PROBE AMP (~~LOC~~) NOT AT ARMC
Chlamydia: NEGATIVE
Comment: NEGATIVE
Comment: NORMAL
Neisseria Gonorrhea: NEGATIVE

## 2020-06-16 ENCOUNTER — Other Ambulatory Visit: Payer: Self-pay

## 2020-06-16 ENCOUNTER — Encounter (HOSPITAL_COMMUNITY): Payer: Self-pay

## 2020-06-16 ENCOUNTER — Ambulatory Visit (HOSPITAL_COMMUNITY)
Admission: RE | Admit: 2020-06-16 | Discharge: 2020-06-16 | Disposition: A | Discharge status: Home / Self Care | Payer: BC Managed Care – PPO | Source: Ambulatory Visit | Attending: Family Medicine | Admitting: Family Medicine

## 2020-06-16 ENCOUNTER — Other Ambulatory Visit (HOSPITAL_COMMUNITY): Payer: Self-pay | Admitting: Family Medicine

## 2020-06-16 DIAGNOSIS — R109 Unspecified abdominal pain: Secondary | ICD-10-CM

## 2020-06-30 ENCOUNTER — Telehealth: Payer: Self-pay | Admitting: Cardiology

## 2020-06-30 ENCOUNTER — Encounter: Payer: Self-pay | Admitting: General Practice

## 2020-06-30 NOTE — Telephone Encounter (Signed)
  Attempted to contact patient 3 times to schedule recall appt, recall letter sent

## 2021-06-23 IMAGING — CR PORTABLE CHEST - 1 VIEW
1 series · 2 of 2 positions shown · non-contrast
Comparison: 05/22/2018

CLINICAL DATA: Pt had COVID back [REDACTED]. Pt is having pain under
right breast in her abdominal area. Has breathing issues when she
lies flat. PCP concerned for a PE. Hx HTNsob

EXAM:
PORTABLE CHEST 1 VIEW

[Series 1: portable · 0.17mm/px · 2 of 2 slices shown]
[im 1/2]
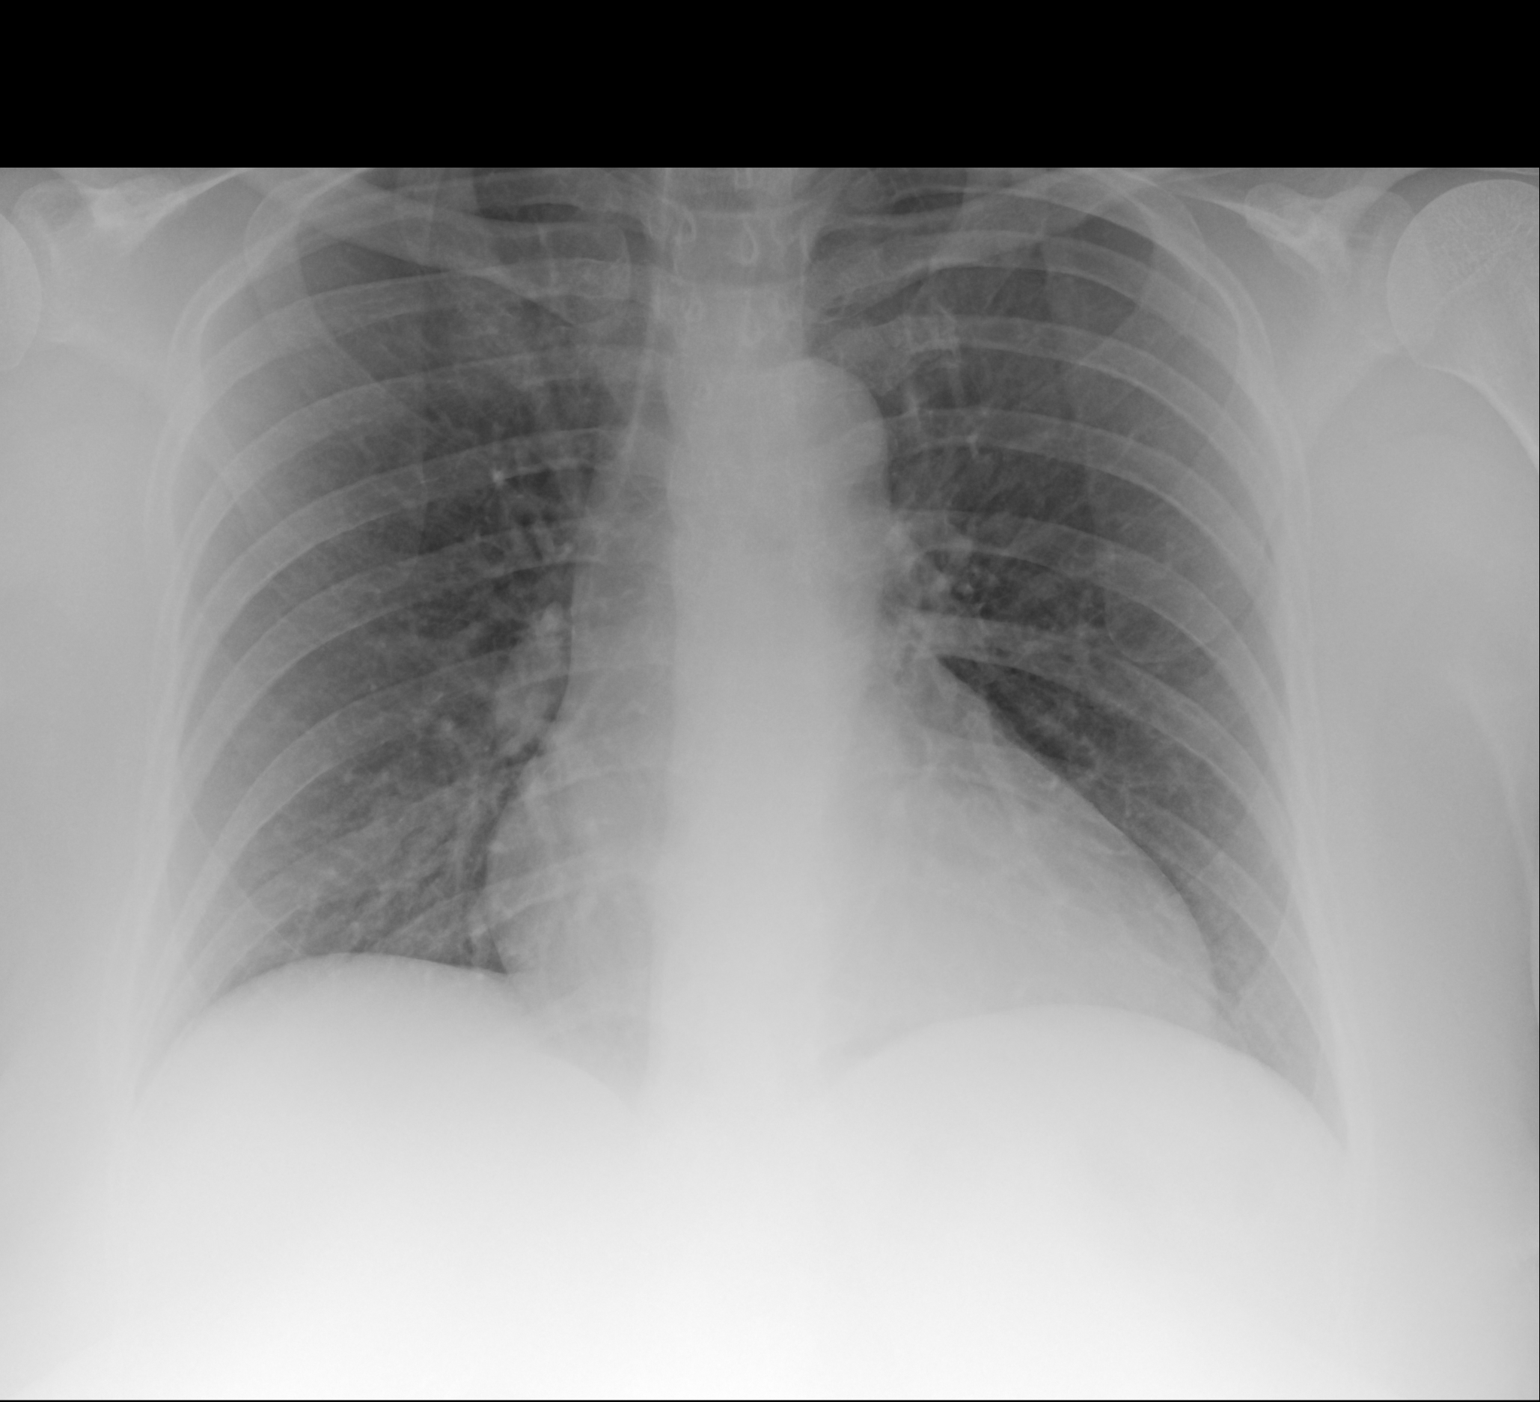
[im 2/2]
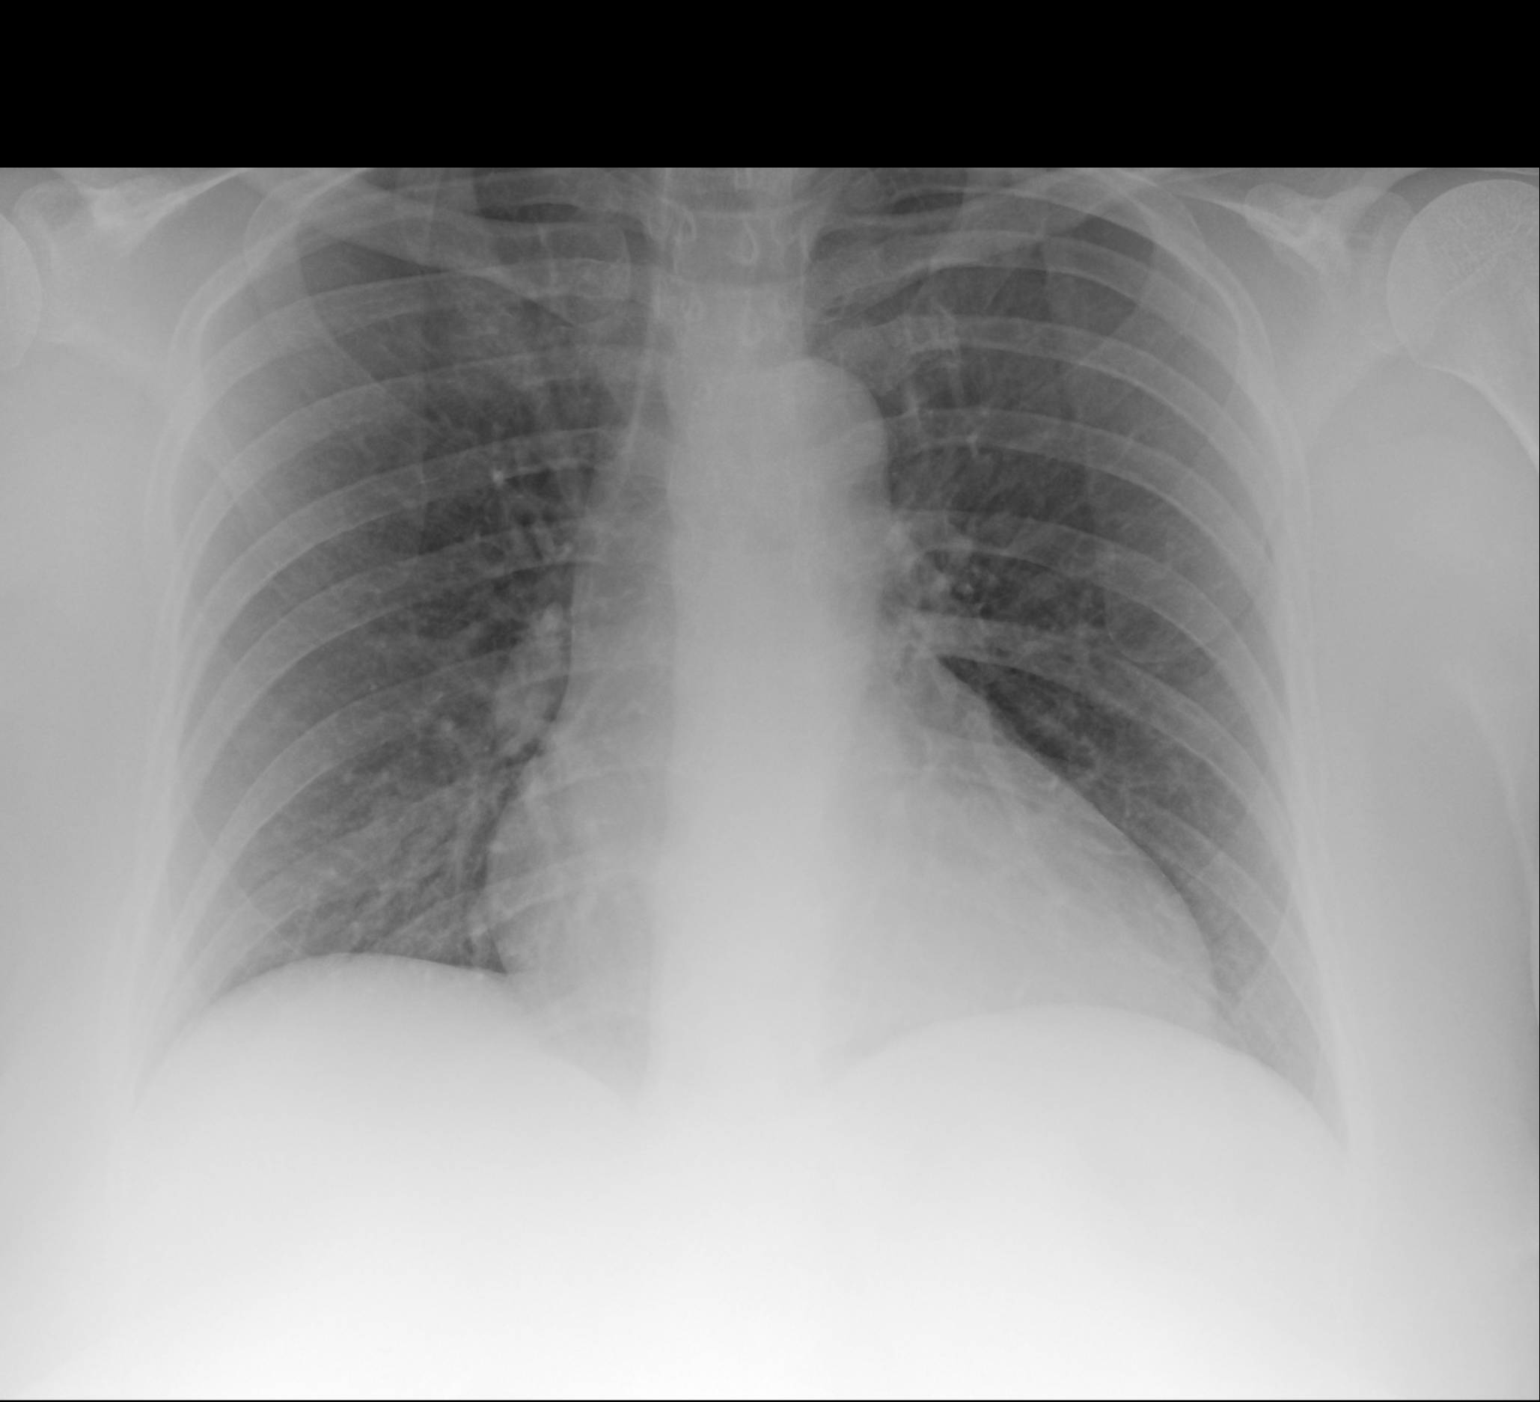

[2 of 2 positions shown; findings below may reference images not displayed]

FINDINGS: The heart size and mediastinal contours are within normal limits.
Both lungs are clear. The visualized skeletal structures are
unremarkable.
IMPRESSION: No acute cardiopulmonary process.

## 2021-09-29 ENCOUNTER — Encounter: Payer: Self-pay | Admitting: Internal Medicine

## 2021-10-20 ENCOUNTER — Ambulatory Visit: Payer: BC Managed Care – PPO | Admitting: Internal Medicine

## 2021-10-26 HISTORY — PX: SHOULDER SURGERY: SHX246

## 2021-11-23 ENCOUNTER — Ambulatory Visit: Payer: BC Managed Care – PPO | Admitting: Internal Medicine

## 2021-12-06 ENCOUNTER — Ambulatory Visit: Payer: BC Managed Care – PPO | Admitting: Internal Medicine

## 2021-12-06 ENCOUNTER — Encounter: Payer: Self-pay | Admitting: Internal Medicine

## 2021-12-06 VITALS — BP 150/100 | HR 80 | Ht 65.0 in | Wt 264.0 lb

## 2021-12-06 DIAGNOSIS — K5900 Constipation, unspecified: Secondary | ICD-10-CM | POA: Diagnosis not present

## 2021-12-06 DIAGNOSIS — R1310 Dysphagia, unspecified: Secondary | ICD-10-CM

## 2021-12-06 DIAGNOSIS — R7401 Elevation of levels of liver transaminase levels: Secondary | ICD-10-CM | POA: Diagnosis not present

## 2021-12-06 DIAGNOSIS — Z1211 Encounter for screening for malignant neoplasm of colon: Secondary | ICD-10-CM | POA: Diagnosis not present

## 2021-12-06 NOTE — Progress Notes (Signed)
? ?Chief Complaint: Constipation ? ?HPI : 51 year old female with history of GERD, hypothyroidism, obesity, and TIA presents with constipation ? ?Her PCP recommended that she have a colonoscopy for colon cancer screening. Denies blood in stools. She has constipation issues. On average she has one BM every 3 days. She has lower ab pain related to constipation that gets better after she has a BM. She does not take any laxative medications. She does not think that she drinks enough water every day. She does have dysphagia that has been stable over time for years. Endorses dysphagia to solids, denies dysphagia to liquids. If food gets stuck, then she will throw up her food. Dysphagia occurs every few weeks. Denies prior EGD and colonoscopy. She has had a cholecystectomy in the past. Denies weight loss. Endorses GERD for which she uses Tums PRN. The Tums keeps her symptoms under good control. Denies fam hx of GI cancers. Endorse fam hx of colon polyps in her father and grandfather (mother's side). She has had N&V when she had sedation previously. Denies alcohol use. She has had 3 children in the past via vaginal delivery. She works in [employer], educational, TE[employer] teacher [employer] and recently had an accident where her arm got caught in a machine.  ? ?Past Medical History:  ?Diagnosis Date  ? Asthma   ? Complication of anesthesia   ? GERD (gastroesophageal reflux disease)   ? Hypertension   ? Hypothyroidism   ? PONV (postoperative nausea and vomiting)   ? Seasonal allergies   ? Thyroid disease   ? ?Past Surgical History:  ?Procedure Laterality Date  ? CERVICAL CONE BIOPSY    ? CERVICAL CONIZATION W/BX N/A 06/28/2018  ? Procedure: CONIZATION CERVIX WITH BIOPSY;  Surgeon: Ena Dawley, MD;  Location: [facility];  Service: Gynecology;  Laterality: N/A;  ? CHOLECYSTECTOMY    ? RIGHT/LEFT HEART CATH AND CORONARY ANGIOGRAPHY N/A 10/17/2017  ? Procedure: RIGHT/LEFT HEART CATH AND CORONARY ANGIOGRAPHY;  Surgeon: Leonie Man, MD;   Location: [facility];  Service: Cardiovascular;:: Normal LVEDP.  Possibly 2+ MR with normal systolic function.  Normal right heart pressures. -->  Increased amlodipine to 10 mg daily  ? SHOULDER SURGERY  10/2021  ? labrum tear  ? TRANSTHORACIC ECHOCARDIOGRAM  02/'19; 9/'20  ? EF 60-65%.  No RWMA.  Normal PA pressures.  Normal valves.  Essentially normal echo;; 9/'20:  EF 60 to 65%.  Normal wall motion.  Mild left atrial dilation suggestive of mild diastolic dysfunction (however parameters were normal.  Normal RV size and function.  ? TUBAL LIGATION    ? VAGINAL HYSTERECTOMY Right 08/09/2018  ? Procedure: HYSTERECTOMY VAGINAL WITH RIGHT SALPINGECTOMY;  Surgeon: Ena Dawley, MD;  Location: [facility];  Service: Gynecology;  Laterality: Right;  ? ?Family History  ?Problem Relation Age of Onset  ? Stroke Mother   ? Hypertension Mother   ? Hypothyroidism Mother   ? Heart attack Father 63  ? CAD Father   ?     stents  ? Hypothyroidism Sister   ? Skin cancer Sister   ? Hypothyroidism Sister   ? Hypertension Maternal Grandmother   ? Arthritis Maternal Grandmother   ? Seizures Maternal Aunt   ? Aneurysm Maternal Aunt   ? Colon cancer Neg Hx   ? Esophageal cancer Neg Hx   ? Stomach cancer Neg Hx   ? ?Social History  ? ?Tobacco Use  ? Smoking status: Never  ? Smokeless tobacco: Never  ?  Vaping Use  ? Vaping Use: Never used  ?Substance Use Topics  ? Alcohol use: Yes  ?  Comment: rarely  ? Drug use: No  ? ?Current Outpatient Medications  ?Medication Sig Dispense Refill  ? calcium carbonate (TUMS EX) 750 MG chewable tablet Chew 1 tablet by mouth daily as needed for heartburn.    ? levothyroxine (SYNTHROID) 175 MCG tablet Take 175 mcg by mouth daily before breakfast.    ? lisinopril-hydrochlorothiazide (ZESTORETIC) 20-12.5 MG tablet Take 2 tablets by mouth daily. 60 tablet 0  ? Multiple Vitamin (MULTIVITAMIN) tablet Take 1 tablet by mouth daily.    ? valACYclovir (VALTREX) 500 MG tablet Take 2,000 mg by  mouth daily as needed (for fever blisters).     ? ?No current facility-administered medications for this visit.  ? ?Allergies  ?Allergen Reactions  ? Iodinated Contrast Media Itching  ? Codeine Hives, Itching and Other (See Comments)  ?  burning  ? Gadolinium Derivatives Hives and Other (See Comments)  ?  Patient had a few hives that were mildly itchy. Checked by dr Carlis Abbott, since severity was so low, and patient had no driver, he recommended she take benadryl at home. She remained in office for 40 minutes post injection to be observed.   ? Tape Swelling and Rash  ? ? ? ?Review of Systems: ?All systems reviewed and negative except where noted in HPI.  ? ?Physical Exam: ?BP (!) 150/100   Pulse 80   Ht '5\' 5"'$  (1.651 m)   Wt 264 lb (119.7 kg)   LMP 08/08/2018   SpO2 98%   BMI 43.93 kg/m?  ?Constitutional: Pleasant,well-developed, female in no acute distress. ?HEENT: Normocephalic and atraumatic. Conjunctivae are normal. No scleral icterus. ?Cardiovascular: Normal rate, regular rhythm.  ?Pulmonary/chest: Effort normal and breath sounds normal. No wheezing, rales or rhonchi. ?Abdominal: Soft, nondistended, nontender. Bowel sounds active throughout. There are no masses palpable. No hepatomegaly. ?Extremities: No edema ?Neurological: Alert and oriented to person place and time. ?Skin: Skin is warm and dry. No rashes noted. ?Psychiatric: Normal mood and affect. Behavior is normal. ? ?Labs 05/2020: CBC nml. CMP with mildly elevated ALT of 47. ? ?CT A/P w/o contrast 06/13/20: ?IMPRESSION: ?1. The appendix is not definitively visualized. There is no ?secondary evidence of appendicitis. ?2. There is a dominant cyst/follicle in the right ovary measuring ?2.8 cm in size. It is possible this is the source of the patient's ?pain. ?3. Mild fecal loading in the ascending colon ? ?ASSESSMENT AND PLAN: ?Colon cancer screening ?Constipation ?Dysphagia ?Elevated AST ?Patient presents discuss colon cancer screening.  I went over the  risk and benefits of colonoscopy in detail with the patient, and she is agreeable to proceeding with colonoscopy.  Since patient also describes years of dysphagia, will also add on an EGD as part of her evaluation.  Patient also has had issues with constipation which has led to some bloating and lower abdominal discomfort.  Will plan to institute some basic treatments for constipation. She may benefit from pelvic physical therapy in the future.  On her last set of labs she was noted to have a mildly elevated ALT 47, which could be suggestive of fatty liver disease.  We will recheck her liver function test today to see if she has persistent LFT elevation ?- Check CMP ?- Start drinking 8 cups of water  ?- Start fiber supplement with Metamucil or Benefiber ?- Start daily Miralax ?- EGD/colonoscopy [facility] ? ?[name], MD ? ?

## 2021-12-06 NOTE — Patient Instructions (Addendum)
If you are age 51 or older, your body mass index should be between 23-30. Your Body mass index is 43.93 kg/m?Marland Kitchen If this is out of the aforementioned range listed, please consider follow up with your Primary Care Provider. ? ?If you are age 40 or younger, your body mass index should be between 19-25. Your Body mass index is 43.93 kg/m?Marland Kitchen If this is out of the aformentioned range listed, please consider follow up with your Primary Care Provider.  ? ?________________________________________________________ ? ?The [facility] providers would like to encourage you to use [facility] to communicate with providers for non-urgent requests or questions.  Due to long hold times on the telephone, sending your provider a message by [facility] may be a faster and more efficient way to get a response.  Please allow 48 business hours for a response.  Please remember that this is for non-urgent requests.  ?_______________________________________________________ ? ?You have been scheduled for an endoscopy and colonoscopy. Please follow the written instructions given to you at your visit today. ?Please pick up your prep supplies at the pharmacy within the next 1-3 days. ?If you use inhalers (even only as needed), please bring them with you on the day of your procedure. ? ?Drink 8 cups of water a day and increase physical activity ? ?Please purchase the following medications over the counter and take as directed: ?Fiber supplement such as Benefiber- use as directed daily ?Miralax: Take as  directed up to 3 times a day to achieve regular bowel movements  ? ?It was a pleasure to see you today! ? ?Thank you for trusting me with your gastrointestinal care!   ? ?[name] , MD  ?

## 2022-01-11 ENCOUNTER — Encounter: Payer: Self-pay | Admitting: Internal Medicine

## 2022-01-16 ENCOUNTER — Telehealth: Payer: Self-pay | Admitting: Internal Medicine

## 2022-01-16 MED ORDER — SUTAB 1479-225-188 MG PO TABS: 1.0000 | ORAL_TABLET | Freq: Once | ORAL | 0 refills | Status: AC

## 2022-01-16 NOTE — Telephone Encounter (Signed)
Patient has procedure on Wednesday but her prescription for her prep is not at her pharmacy.  She wanted to do the pills.  Thank you.

## 2022-01-16 NOTE — Telephone Encounter (Signed)
Sent Sutab to patient's pharmacy

## 2022-01-18 ENCOUNTER — Ambulatory Visit (AMBULATORY_SURGERY_CENTER): Payer: BC Managed Care – PPO | Admitting: Internal Medicine

## 2022-01-18 ENCOUNTER — Encounter: Payer: Self-pay | Admitting: Internal Medicine

## 2022-01-18 VITALS — BP 130/88 | HR 62 | Temp 98.0°F | Resp 13 | Ht 65.0 in | Wt 265.0 lb

## 2022-01-18 DIAGNOSIS — K5900 Constipation, unspecified: Secondary | ICD-10-CM

## 2022-01-18 DIAGNOSIS — K317 Polyp of stomach and duodenum: Secondary | ICD-10-CM

## 2022-01-18 DIAGNOSIS — Z1211 Encounter for screening for malignant neoplasm of colon: Secondary | ICD-10-CM | POA: Diagnosis present

## 2022-01-18 DIAGNOSIS — K222 Esophageal obstruction: Secondary | ICD-10-CM | POA: Diagnosis not present

## 2022-01-18 DIAGNOSIS — K449 Diaphragmatic hernia without obstruction or gangrene: Secondary | ICD-10-CM

## 2022-01-18 DIAGNOSIS — R1310 Dysphagia, unspecified: Secondary | ICD-10-CM

## 2022-01-18 DIAGNOSIS — K635 Polyp of colon: Secondary | ICD-10-CM | POA: Diagnosis not present

## 2022-01-18 DIAGNOSIS — D120 Benign neoplasm of cecum: Secondary | ICD-10-CM

## 2022-01-18 DIAGNOSIS — K2970 Gastritis, unspecified, without bleeding: Secondary | ICD-10-CM | POA: Diagnosis not present

## 2022-01-18 DIAGNOSIS — K219 Gastro-esophageal reflux disease without esophagitis: Secondary | ICD-10-CM

## 2022-01-18 DIAGNOSIS — D123 Benign neoplasm of transverse colon: Secondary | ICD-10-CM

## 2022-01-18 DIAGNOSIS — D124 Benign neoplasm of descending colon: Secondary | ICD-10-CM

## 2022-01-18 DIAGNOSIS — K259 Gastric ulcer, unspecified as acute or chronic, without hemorrhage or perforation: Secondary | ICD-10-CM

## 2022-01-18 DIAGNOSIS — K3189 Other diseases of stomach and duodenum: Secondary | ICD-10-CM | POA: Diagnosis not present

## 2022-01-18 MED ORDER — SODIUM CHLORIDE 0.9 % IV SOLN: 500.0000 mL | Freq: Once | INTRAVENOUS | Status: DC

## 2022-01-18 MED ORDER — OMEPRAZOLE 40 MG PO CPDR: 40.0000 mg | DELAYED_RELEASE_CAPSULE | Freq: Two times a day (BID) | ORAL | 1 refills | Status: AC

## 2022-01-18 NOTE — Progress Notes (Signed)
Report to PACU, RN, vss, BBS= Clear.  

## 2022-01-18 NOTE — Op Note (Signed)
[place] Patient Name: Tammie Powell Procedure Date: 01/18/2022 2:37 PM MRN: [id number] Endoscopist: Sonny Masters "Tammie Powell ,  Age: 51 Referring MD:  Date of Birth: 28-May-1971 Gender: Female Account #: [id number] Procedure:                Colonoscopy Indications:              Screening for colorectal malignant neoplasm Medicines:                Monitored Anesthesia Care Procedure:                Pre-Anesthesia Assessment:                           - Prior to the procedure, a History and Physical                            was performed, and patient medications and                            allergies were reviewed. The patient's tolerance of                            previous anesthesia was also reviewed. The risks                            and benefits of the procedure and the sedation                            options and risks were discussed with the patient.                            All questions were answered, and informed consent                            was obtained. Prior Anticoagulants: The patient has                            taken no previous anticoagulant or antiplatelet                            agents. ASA Grade Assessment: III - A patient with                            severe systemic disease. After reviewing the risks                            and benefits, the patient was deemed in                            satisfactory condition to undergo the procedure.                           After obtaining informed consent, the colonoscope  was passed under direct vision. Throughout the                            procedure, the patient's blood pressure, pulse, and                            oxygen saturations were monitored continuously. The                            CF [id number] #[id number] was introduced through the anus                            and advanced to the the terminal ileum. The                            colonoscopy  was performed without difficulty. The                            patient tolerated the procedure well. The quality                            of the bowel preparation was good. The terminal                            ileum, ileocecal valve, appendiceal orifice, and                            rectum were photographed. Scope In: 3:11:17 PM Scope Out: 3:29:29 PM Scope Withdrawal Time: 0 hours 16 minutes 7 seconds  Total Procedure Duration: 0 hours 18 minutes 12 seconds  Findings:                 The terminal ileum appeared normal.                           Four sessile polyps were found in the transverse                            colon, ascending colon and cecum. The polyps were 3                            to 6 mm in size. These polyps were removed with a                            cold snare. Resection and retrieval were complete.                           Non-bleeding internal hemorrhoids were found during                            retroflexion. Complications:            No immediate complications. Estimated Blood Loss:     Estimated blood loss was minimal. Impression:               -  The examined portion of the ileum was normal.                           - Four 3 to 6 mm polyps in the transverse colon, in                            the ascending colon and in the cecum, removed with                            a cold snare. Resected and retrieved.                           - Non-bleeding internal hemorrhoids. Recommendation:           - Discharge patient to home (with escort).                           - Await pathology results.                           - The findings and recommendations were discussed                            with the patient. [id number]Christia Powell,  01/18/2022 3:38:34 PM

## 2022-01-18 NOTE — Progress Notes (Signed)
GASTROENTEROLOGY PROCEDURE H&P NOTE   Primary Care Physician: Sharilyn Sites, MD    Reason for Procedure:   Colon cancer screening, dysphagia  Plan:    EGD/colonoscopy  Patient is appropriate for endoscopic procedure(s) in the ambulatory ([facility]) setting.  The nature of the procedure, as well as the risks, benefits, and alternatives were carefully and thoroughly reviewed with the patient. Ample time for discussion and questions allowed. The patient understood, was satisfied, and agreed to proceed.     HPI: Tammie Powell is a 51 y.o. female who presents for EGD/colonoscopy for evaluation of dysphagia and colon cancer screening .  Patient was most recently seen in the Gastroenterology Clinic on 12/06/21.  No interval change in medical history since that appointment. Please refer to that note for full details regarding GI history and clinical presentation.   Past Medical History:  Diagnosis Date   Asthma    Complication of anesthesia    GERD (gastroesophageal reflux disease)    Hypertension    Hypothyroidism    PONV (postoperative nausea and vomiting)    Seasonal allergies    Thyroid disease     Past Surgical History:  Procedure Laterality Date   CERVICAL CONE BIOPSY     CERVICAL CONIZATION W/BX N/A 06/28/2018   Procedure: CONIZATION CERVIX WITH BIOPSY;  Surgeon: Ena Dawley, MD;  Location: [facility];  Service: Gynecology;  Laterality: N/A;   CHOLECYSTECTOMY     RIGHT/LEFT HEART CATH AND CORONARY ANGIOGRAPHY N/A 10/17/2017   Procedure: RIGHT/LEFT HEART CATH AND CORONARY ANGIOGRAPHY;  Surgeon: Leonie Man, MD;  Location: [facility];  Service: Cardiovascular;:: Normal LVEDP.  Possibly 2+ MR with normal systolic function.  Normal right heart pressures. -->  Increased amlodipine to 10 mg daily   SHOULDER SURGERY  10/2021   labrum tear   TRANSTHORACIC ECHOCARDIOGRAM  02/'19; 9/'20   EF 60-65%.  No RWMA.  Normal PA pressures.  Normal valves.  Essentially normal  echo;; 9/'20:  EF 60 to 65%.  Normal wall motion.  Mild left atrial dilation suggestive of mild diastolic dysfunction (however parameters were normal.  Normal RV size and function.   TUBAL LIGATION     VAGINAL HYSTERECTOMY Right 08/09/2018   Procedure: HYSTERECTOMY VAGINAL WITH RIGHT SALPINGECTOMY;  Surgeon: Ena Dawley, MD;  Location: [facility];  Service: Gynecology;  Laterality: Right;    Prior to Admission medications   Medication Sig Start Date End Date Taking? Authorizing Provider  ASPIRIN 81 PO  ASPIRIN LOW EC '81MG'$  TAB, 0 Refill(s), 107.6 05/30/19  Yes [provider]  levothyroxine (SYNTHROID) 175 MCG tablet Take 175 mcg by mouth daily before breakfast.   Yes [provider]  lisinopril-hydrochlorothiazide (ZESTORETIC) 20-12.5 MG tablet Take 2 tablets by mouth daily. 10/09/19  Yes Leonie Man, MD  ondansetron (ZOFRAN) 8 MG tablet ondansetron HCl 8 mg tablet  TAKE 1 TABLET BY MOUTH EVERY 8 HOURS AS NEEDED FOR NAUSEA FOR 5 DAYS   Yes [provider]  calcium carbonate (TUMS EX) 750 MG chewable tablet Chew 1 tablet by mouth daily as needed for heartburn.    [provider]  Multiple Vitamin (MULTIVITAMIN) tablet Take 1 tablet by mouth daily.    [provider]  phentermine (ADIPEX-P) 37.5 MG tablet Take 37.5 mg by mouth every morning. 12/10/21   [provider]  valACYclovir (VALTREX) 500 MG tablet Take 2,000 mg by mouth daily as needed (for fever blisters).     [provider]  Current Outpatient Medications  Medication Sig Dispense Refill   ASPIRIN 81 PO  ASPIRIN LOW EC '81MG'$  TAB, 0 Refill(s), 107.6     levothyroxine (SYNTHROID) 175 MCG tablet Take 175 mcg by mouth daily before breakfast.     lisinopril-hydrochlorothiazide (ZESTORETIC) 20-12.5 MG tablet Take 2 tablets by mouth daily. 60 tablet 0   ondansetron (ZOFRAN) 8 MG tablet ondansetron HCl 8 mg tablet  TAKE 1 TABLET BY MOUTH EVERY 8 HOURS AS  NEEDED FOR NAUSEA FOR 5 DAYS     calcium carbonate (TUMS EX) 750 MG chewable tablet Chew 1 tablet by mouth daily as needed for heartburn.     Multiple Vitamin (MULTIVITAMIN) tablet Take 1 tablet by mouth daily.     phentermine (ADIPEX-P) 37.5 MG tablet Take 37.5 mg by mouth every morning.     valACYclovir (VALTREX) 500 MG tablet Take 2,000 mg by mouth daily as needed (for fever blisters).      Current Facility-Administered Medications  Medication Dose Route Frequency Provider Last Rate Last Admin   0.9 %  sodium chloride infusion  500 mL Intravenous Once Sharyn Creamer, MD        Allergies as of 01/18/2022 - Review Complete 01/18/2022  Allergen Reaction Noted   Iodinated contrast media Itching 05/05/2017   Codeine Hives, Itching, and Other (See Comments) 12/24/2011   Gadolinium derivatives Hives and Other (See Comments) 09/12/2016   Tape Swelling and Rash 05/22/2018    Family History  Problem Relation Age of Onset   Stroke Mother    Hypertension Mother    Hypothyroidism Mother    Heart attack Father 66   CAD Father        stents   Hypothyroidism Sister    Skin cancer Sister    Hypothyroidism Sister    Seizures Maternal Aunt    Aneurysm Maternal Aunt    Hypertension Maternal Grandmother    Arthritis Maternal Grandmother    Colon cancer Neg Hx    Esophageal cancer Neg Hx    Stomach cancer Neg Hx    Rectal cancer Neg Hx     Social History   Socioeconomic History   Marital status: Legally Separated    Spouse name: Not on file   Number of children: 3   Years of education: Not on file   Highest education level: Not on file  Occupational History   Not on file  Tobacco Use   Smoking status: Never   Smokeless tobacco: Never  Vaping Use   Vaping Use: Never used  Substance and Sexual Activity   Alcohol use: Yes    Comment: rarely   Drug use: No   Sexual activity: Not on file  Other Topics Concern   Not on file  Social History Narrative   She currently works at a  [employer], building tires   She never smoked. Does not do routine exercise, but has recently started adjusting her diet.   Social Determinants of Health   Financial Resource Strain: Not on file  Food Insecurity: Not on file  Transportation Needs: Not on file  Physical Activity: Not on file  Stress: Not on file  Social Connections: Not on file  Intimate Partner Violence: Not on file    Physical Exam: Vital signs in last 24 hours: BP (!) 208/118   Pulse 69   Temp 98 F (36.7 C) (Skin)   Resp 13   Ht '5\' 5"'$  (1.651 m)   Wt 265 lb (120.2 kg)   LMP 08/08/2018  SpO2 99%   BMI 44.10 kg/m  GEN: NAD EYE: Sclerae anicteric ENT: MMM CV: Non-tachycardic Pulm: No increased WOB GI: Soft NEURO:  Alert & Oriented   [name], MD [facility]   01/18/2022 2:45 PM

## 2022-01-18 NOTE — Patient Instructions (Signed)
Thank you for coming in to see [place] today! Resume previous diet and medications today. Return to normal daily activities tomorrow. Prescription for Prilosec 40 mg twice per day before first and last meal of the day.  This was sent to your pharmacy.   YOU HAD AN ENDOSCOPIC PROCEDURE TODAY AT [facility]:   Refer to the procedure report that was given to you for any specific questions about what was found during the examination.  If the procedure report does not answer your questions, please call your gastroenterologist to clarify.  If you requested that your care partner not be given the details of your procedure findings, then the procedure report has been included in a sealed envelope for you to review at your convenience later.  YOU SHOULD EXPECT: Some feelings of bloating in the abdomen. Passage of more gas than usual.  Walking can help get rid of the air that was put into your GI tract during the procedure and reduce the bloating. If you had a lower endoscopy (such as a colonoscopy or flexible sigmoidoscopy) you may notice spotting of blood in your stool or on the toilet paper. If you underwent a bowel prep for your procedure, you may not have a normal bowel movement for a few days.  Please Note:  You might notice some irritation and congestion in your nose or some drainage.  This is from the oxygen used during your procedure.  There is no need for concern and it should clear up in a day or so.  SYMPTOMS TO REPORT IMMEDIATELY:  Following lower endoscopy (colonoscopy or flexible sigmoidoscopy):  Excessive amounts of blood in the stool  Significant tenderness or worsening of abdominal pains  Swelling of the abdomen that is new, acute  Fever of 100F or higher  Following upper endoscopy (EGD)  Vomiting of blood or coffee ground material  New chest pain or pain under the shoulder blades  Painful or persistently difficult swallowing  New shortness of breath  Fever of 100F or  higher  Black, tarry-looking stools  For urgent or emergent issues, a gastroenterologist can be reached at any hour by calling [phone]. Do not use MyChart messaging for urgent concerns.    DIET:  We do recommend a small meal at first, but then you may proceed to your regular diet.  Drink plenty of fluids but you should avoid alcoholic beverages for 24 hours.  ACTIVITY:  You should plan to take it easy for the rest of today and you should NOT DRIVE or use heavy machinery until tomorrow (because of the sedation medicines used during the test).    FOLLOW UP: Our staff will call the number listed on your records 48-72 hours following your procedure to check on you and address any questions or concerns that you may have regarding the information given to you following your procedure. If we do not reach you, we will leave a message.  We will attempt to reach you two times.  During this call, we will ask if you have developed any symptoms of COVID 19. If you develop any symptoms (ie: fever, flu-like symptoms, shortness of breath, cough etc.) before then, please call [phone].  If you test positive for Covid 19 in the 2 weeks post procedure, please call and report this information to [place].    If any biopsies were taken you will be contacted by phone or by letter within the next 1-3 weeks.  Please call us at [phone] if  you have not heard about the biopsies in 3 weeks.    SIGNATURES/CONFIDENTIALITY: You and/or your care partner have signed paperwork which will be entered into your electronic medical record.  These signatures attest to the fact that that the information above on your After Visit Summary has been reviewed and is understood.  Full responsibility of the confidentiality of this discharge information lies with you and/or your care-partner.

## 2022-01-18 NOTE — Progress Notes (Signed)
Called to room to assist during endoscopic procedure.  Patient ID and intended procedure confirmed with present staff. Received instructions for my participation in the procedure from the performing physician.  

## 2022-01-18 NOTE — Op Note (Addendum)
[place] Patient Name: Tammie Powell Procedure Date: 01/18/2022 2:48 PM MRN: [id number] Endoscopist: Sonny Masters "Tammie Powell ,  Age: 51 Referring MD:  Date of Birth: [birth year] Gender: Female Account #: [id number] Procedure:                Upper GI endoscopy Indications:              Dysphagia Medicines:                Monitored Anesthesia Care Procedure:                Pre-Anesthesia Assessment:                           - Prior to the procedure, a History and Physical                            was performed, and patient medications and                            allergies were reviewed. The patient's tolerance of                            previous anesthesia was also reviewed. The risks                            and benefits of the procedure and the sedation                            options and risks were discussed with the patient.                            All questions were answered, and informed consent                            was obtained. Prior Anticoagulants: The patient has                            taken no previous anticoagulant or antiplatelet                            agents. ASA Grade Assessment: III - A patient with                            severe systemic disease. After reviewing the risks                            and benefits, the patient was deemed in                            satisfactory condition to undergo the procedure.                           After obtaining informed consent, the endoscope was  passed under direct vision. Throughout the                            procedure, the patient's blood pressure, pulse, and                            oxygen saturations were monitored continuously. The                            Olympus Endoscope [id number] was introduced through                            the mouth, and advanced to the second part of                            duodenum. The upper GI endoscopy was  accomplished                            without difficulty. The patient tolerated the                            procedure well. Scope In: Scope Out: Findings:                 Biopsies were taken with a cold forceps in the                            proximal esophagus and in the distal esophagus for                            histology.                           One benign-appearing, intrinsic moderate                            (circumferential scarring or stenosis; an endoscope                            may pass) stenosis was found at the                            gastroesophageal junction. The stenosis was                            traversed. A TTS dilator was passed through the                            scope. Dilation with an 18-19-20 mm balloon dilator                            was performed to 18 mm. The dilation site was                            examined following endoscope reinsertion and showed  mild mucosal disruption. Biopsies were taken with a                            cold forceps for histology.                           A hiatal hernia was present.                           A few 2 to 4 mm sessile polyps with no bleeding and                            no stigmata of recent bleeding were found in the                            gastric fundus. These polyps were removed with a                            cold biopsy forceps. Resection and retrieval were                            complete.                           Localized inflammation characterized by congestion                            (edema), erosions and erythema was found in the                            gastric antrum. There was overlying hematin.                            Biopsies were taken with a cold forceps for                            histology.                           The examined duodenum was normal. Complications:            No immediate complications. Estimated  Blood Loss:     Estimated blood loss was minimal. Impression:               - Benign-appearing esophageal stenosis. Dilated.                            Biopsied.                           - Hiatal hernia.                           - A few gastric polyps. Resected and retrieved.                           -  Gastritis. Biopsied.                           - Normal examined duodenum.                           - Biopsies were taken with a cold forceps for                            histology in the proximal esophagus and in the                            distal esophagus. Recommendation:           - Use Prilosec (omeprazole) 40 mg PO BID for 8                            weeks.                           - Await pathology results.                           - Return to GI clinic in 2 months.                           - Perform a colonoscopy today. Sonny Masters "Tammie Powell,  01/18/2022 3:36:00 PM

## 2022-01-18 NOTE — Progress Notes (Signed)
VS by DT  Pt's states no medical or surgical changes since previsit or office visit.  

## 2022-01-19 ENCOUNTER — Telehealth: Payer: Self-pay | Admitting: *Deleted

## 2022-01-19 NOTE — Telephone Encounter (Signed)
  Follow up Call-     01/18/2022    1:57 PM  Call back number  Post procedure Call Back phone  # [phone]  Permission to leave phone message Yes     Patient questions:  Do you have a fever, pain , or abdominal swelling? No. Pain Score  0 *  Have you tolerated food without any problems? Yes.    Have you been able to return to your normal activities? Yes.    Do you have any questions about your discharge instructions: Diet   No. Medications  No. Follow up visit  No.  Do you have questions or concerns about your Care? No.  Actions: * If pain score is 4 or above: No action needed, pain <4.

## 2022-01-19 NOTE — Telephone Encounter (Signed)
No answer on first attempt follow up call. Left message.  ?

## 2022-01-23 ENCOUNTER — Encounter: Payer: Self-pay | Admitting: Internal Medicine

## 2022-01-24 ENCOUNTER — Other Ambulatory Visit: Payer: Self-pay

## 2022-01-24 DIAGNOSIS — R112 Nausea with vomiting, unspecified: Secondary | ICD-10-CM

## 2022-01-24 MED ORDER — ONDANSETRON HCL 8 MG PO TABS: 8.0000 mg | ORAL_TABLET | Freq: Three times a day (TID) | ORAL | 0 refills | Status: AC | PRN

## 2022-01-25 ENCOUNTER — Encounter: Payer: Self-pay | Admitting: Internal Medicine

## 2022-03-08 ENCOUNTER — Ambulatory Visit: Payer: BC Managed Care – PPO | Admitting: Internal Medicine

## 2022-03-20 ENCOUNTER — Encounter: Payer: Self-pay | Admitting: Internal Medicine

## 2022-09-26 IMAGING — CT CT ABD-PELV W/O CM
2 of 4 series · 16 of 46 positions shown, 18 images · non-contrast
Comparison: None.

CLINICAL DATA: Lower quadrant pain.  Appendicitis suspected.

EXAM:
CT ABDOMEN AND PELVIS WITHOUT CONTRAST
TECHNIQUE: Multidetector CT imaging of the abdomen and pelvis was performed
following the standard protocol without IV contrast.

[Series 3: ap without · axial · non-contrast · 0.83mm/px · z∈[-478,-54]mm · 13 of 97 slices shown, 15 images]
[im 6/97  soft-tissue]
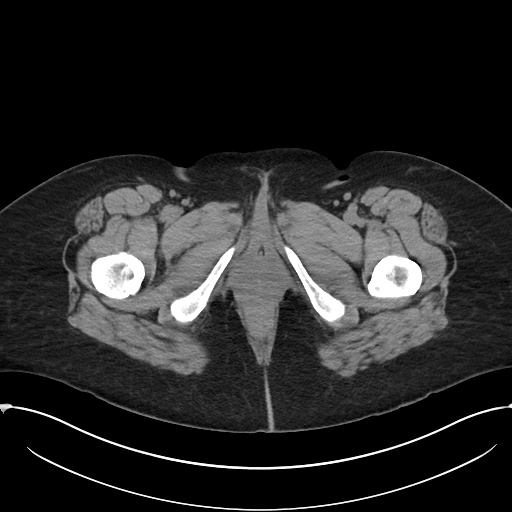
[im 6/97  bone]
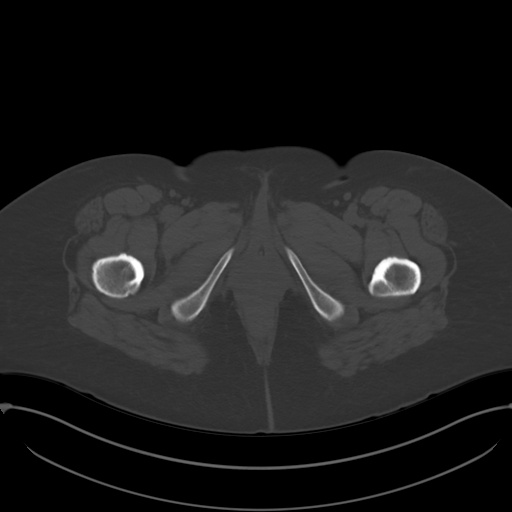
[im 11/97  soft-tissue]
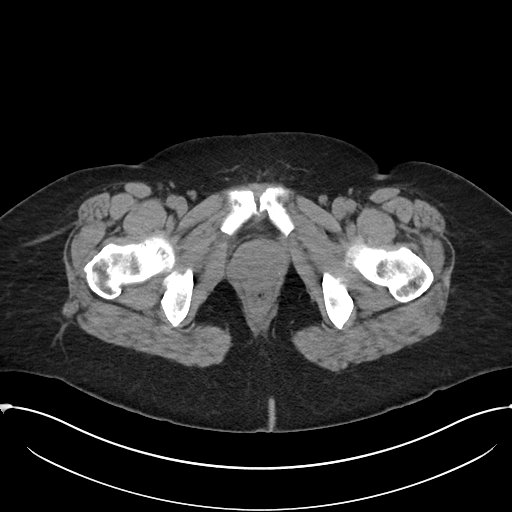
[im 22/97  soft-tissue]
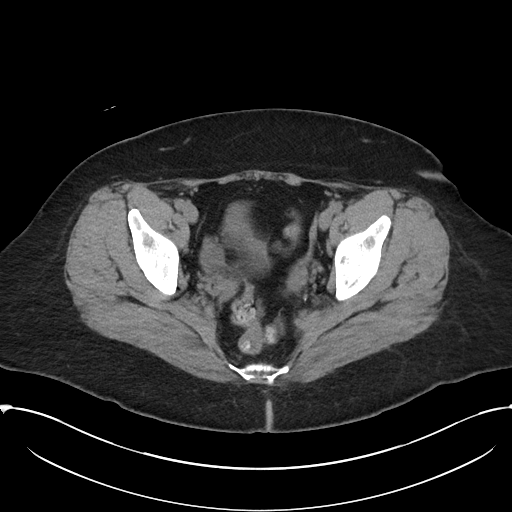
[im 27/97  soft-tissue]
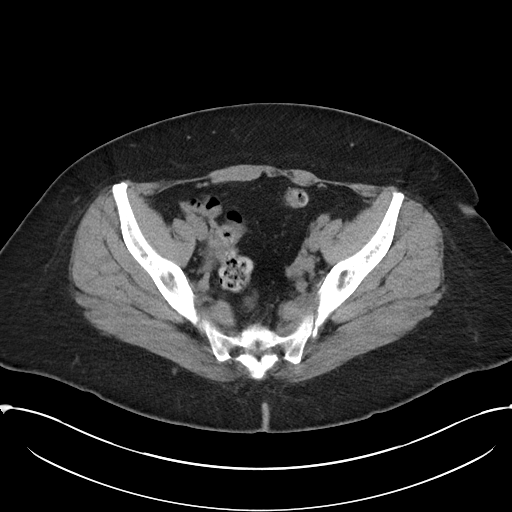
[im 33/97  soft-tissue]
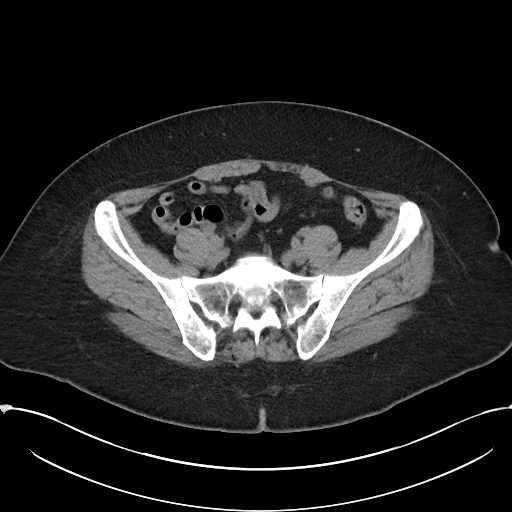
[im 43/97  soft-tissue]
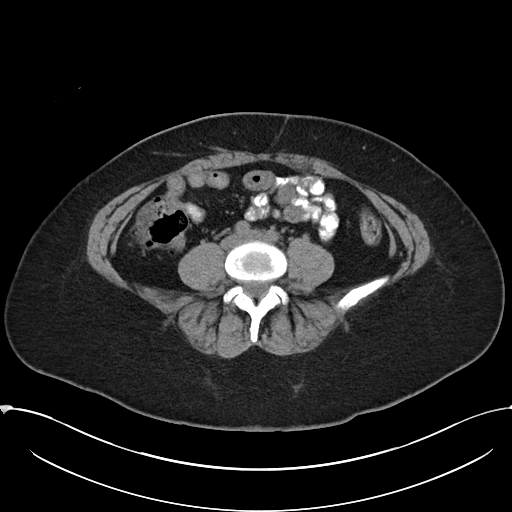
[im 49/97  soft-tissue]
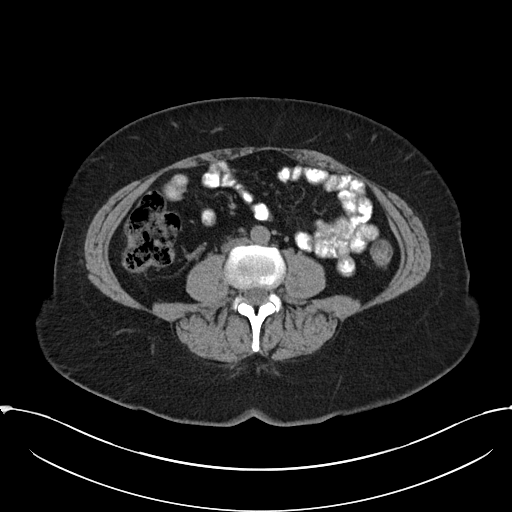
[im 54/97  soft-tissue]
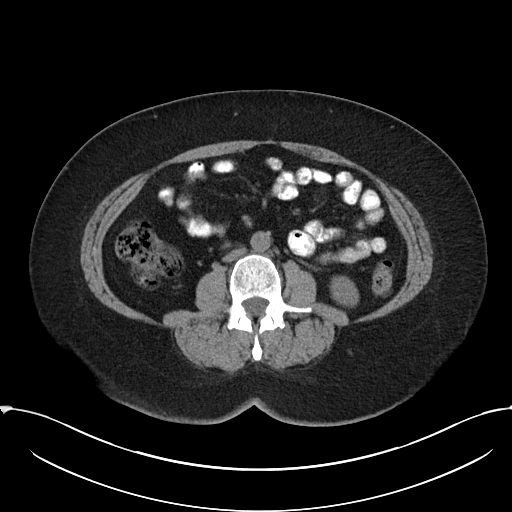
[im 65/97  soft-tissue]
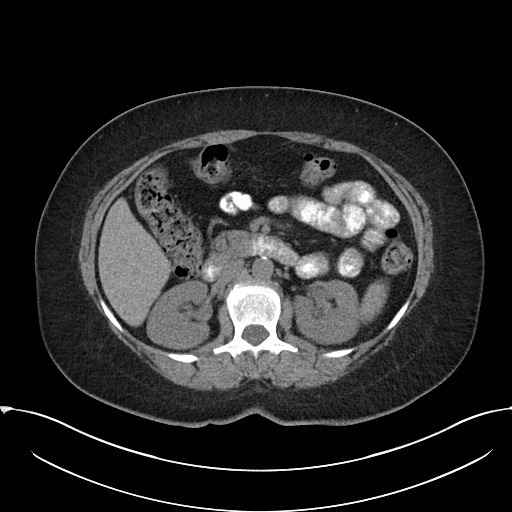
[im 65/97  bone]
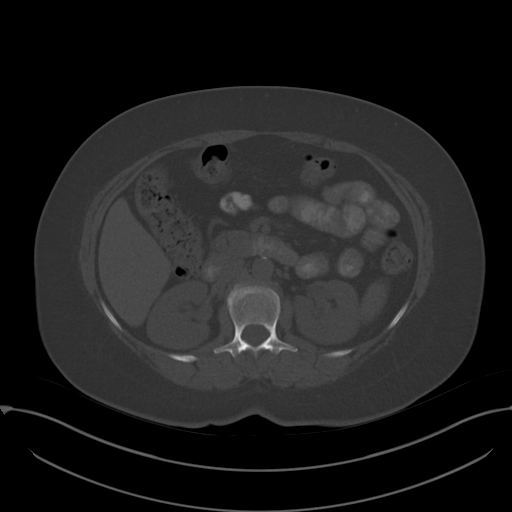
[im 70/97  soft-tissue]
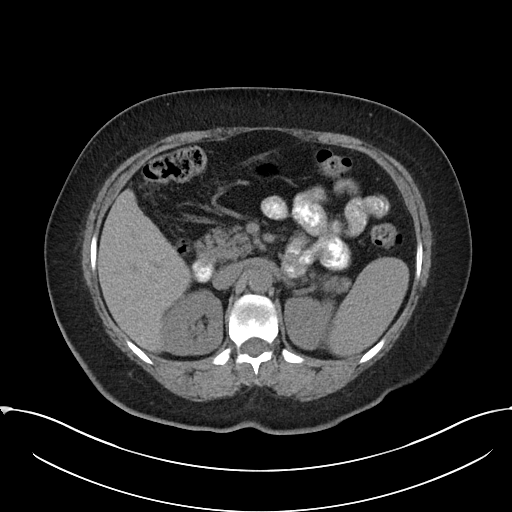
[im 75/97  soft-tissue]
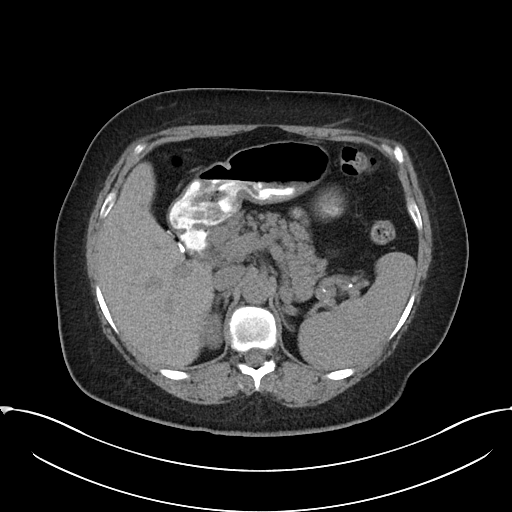
[im 86/97  soft-tissue]
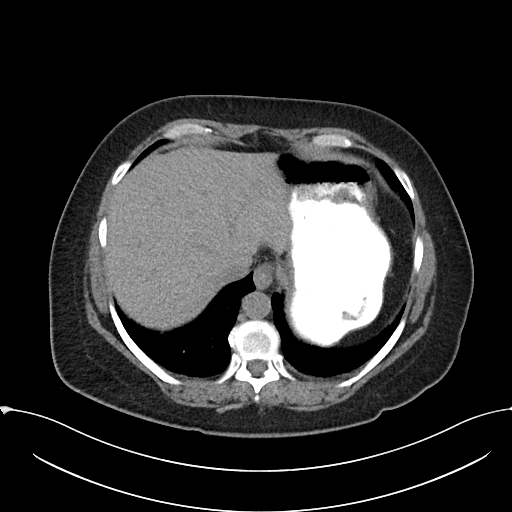
[im 91/97  soft-tissue]
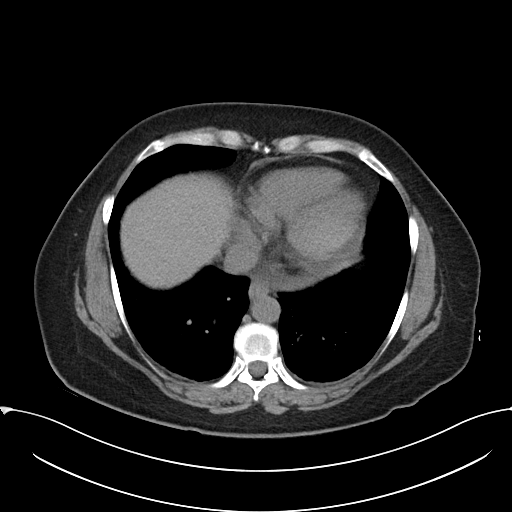

[Series 6: cor · coronal · 0.70mm/px · 3 of 98 slices shown]
[im 33/98  soft-tissue]
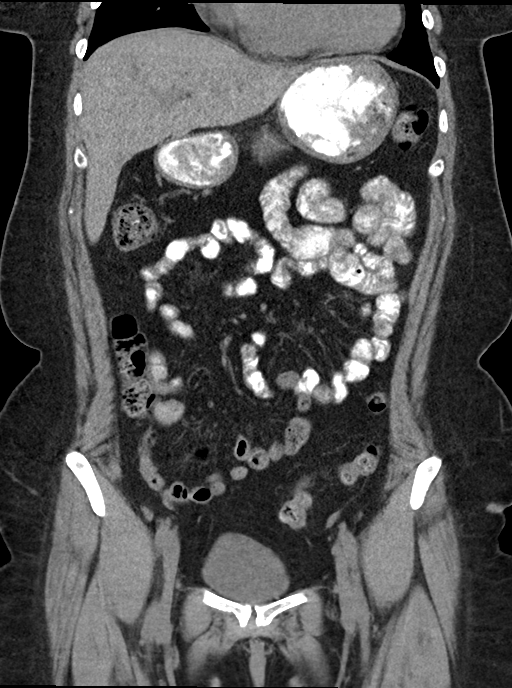
[im 44/98  soft-tissue]
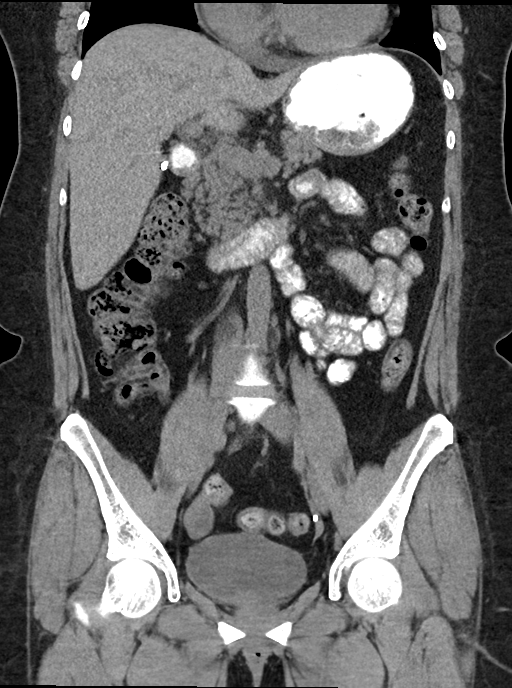
[im 54/98  soft-tissue]
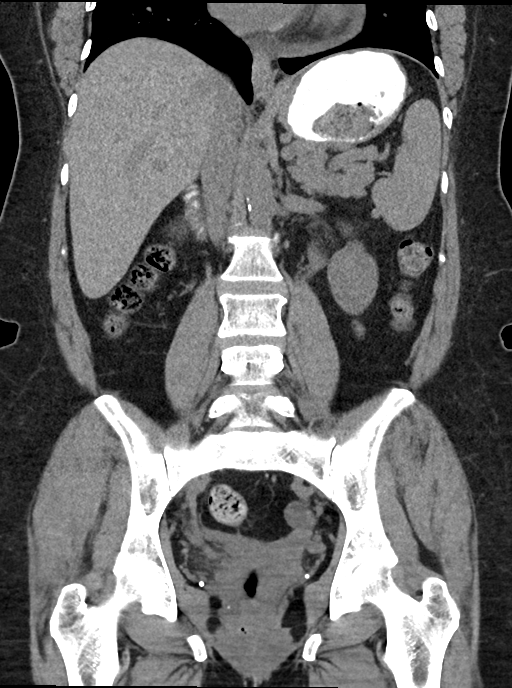

[16 of 46 positions shown; findings below may reference images not displayed]

FINDINGS: Lower chest: The lower chest/lung bases are unremarkable with no
significant abnormalities.

Hepatobiliary: No focal liver abnormality is seen. Status post
cholecystectomy. No biliary dilatation.

Pancreas: Unremarkable. No pancreatic ductal dilatation or
surrounding inflammatory changes.

Spleen: Normal in size without focal abnormality.

Adrenals/Urinary Tract: Adrenal glands are unremarkable. Kidneys are
normal, without renal calculi, focal lesion, or hydronephrosis.
Bladder is unremarkable.

Stomach/Bowel: The stomach and small bowel are normal. Mild fecal
loading in the ascending colon is identified. The colon is otherwise
normal. The appendix is not definitively identified. There is no
secondary evidence of appendicitis.

Vascular/Lymphatic: No significant vascular findings are present. No
enlarged abdominal or pelvic lymph nodes.

Reproductive: Patient is status post hysterectomy. The left ovary is
normal in appearance. There is a cyst or follicle in the right ovary
measuring 2.8 cm.

Other: No abdominal wall hernia or abnormality. No abdominopelvic
ascites.

Musculoskeletal: No acute or significant osseous findings.
IMPRESSION: 1. The appendix is not definitively visualized. There is no
secondary evidence of appendicitis.
2. There is a dominant cyst/follicle in the right ovary measuring
2.8 cm in size. It is possible this is the source of the patient's
pain.
3. Mild fecal loading in the ascending colon.

## 2023-06-13 ENCOUNTER — Ambulatory Visit: Payer: BC Managed Care – PPO | Admitting: Physician Assistant

## 2023-08-02 ENCOUNTER — Ambulatory Visit: Payer: Self-pay | Admitting: Gastroenterology

## 2023-09-21 ENCOUNTER — Ambulatory Visit: Payer: BC Managed Care – PPO | Admitting: Physician Assistant

## 2023-10-11 ENCOUNTER — Encounter: Payer: Self-pay | Admitting: *Deleted

## 2023-10-11 NOTE — Progress Notes (Deleted)
 Chief Complaint: Primary GI MD: Dr. Leonides Schanz  HPI: 53 year old female history of hypothyroidism, GERD, anesthesia complication, TIA, presents for evaluation of  Last seen 11/2021 by Dr. Leonides Schanz At that time she reported years of dysphagia and issues with constipation, bloating, lower abdominal discomfort.  She also was noted to have a mildly elevated ALT of 47.  She is recommended to take fiber supplement, MiraLAX, and set up for EGD/colonoscopy  EGD/colonoscopy May 2023 as below.  She did have esophageal stenosis that was dilated.  Biopsies showed increased intraepithelial eosinophils and reflux changes and she was recommended to follow-up in 3 months but appears she was lost to follow-up.  Negative H. pylori. Colonoscopy showed sessile serrated polyps with recommendation of 3-year repeat  Labs 02/2023 Normal CBC CMP with alk phos 127, AST 29, ALT 42, T. bili 0.6  Discussed the use of AI scribe software for clinical note transcription with the patient, who gave verbal consent to proceed.  History of Present Illness             PREVIOUS GI WORKUP   EGD 01/18/2022 - Benign- appearing esophageal stenosis. Dilated. Biopsied.  - Hiatal hernia.  - A few gastric polyps. Resected and retrieved.  - Gastritis. Biopsied. - Normal examined duodenum.  - Biopsies were taken with a cold forceps for histology in the proximal esophagus and in the distal esophagus.  Colonoscopy 01/18/2022 - The examined portion of the ileum was normal.  - Four 3 to 6 mm polyps in the transverse colon, in the ascending colon and in the cecum, removed with a cold snare. Resected and retrieved.  - Non- bleeding internal hemorrhoids.  Diagnosis 1. Surgical [P], gastric - ANTRAL AND OXYNTIC MUCOSA WITH HYPEREMIA AND MILD CHANGES OF REACTIVE GASTROPATHY. - NO HELICOBACTER PYLORI IDENTIFIED. 2. Surgical [P], gastric polyps - FUNDIC GLAND POLYP. 3. Surgical [P], esophageal stricture - GASTROESOPHAGEAL MUCOSA WITH  REFLUX CHANGES. - NEGATIVE FOR INTESTINAL METAPLASIA OR DYSPLASIA. 4. Surgical [P], esophogeal - SQUAMOUS MUCOSA WITH INCREASED INTRAEPITHELIAL EOSINOPHILS AND REFLUX CHANGES. - FOCALLY GREATER THAN 20 PER HIGH-POWER FIELD. 5. Surgical [P], colon, transverse, cecum and descending, polyp (4) - SESSILE SERRATED POLYP (S) WITHOUT CYTOLOGIC DYSPLASIA. - HYPERPLASTIC POLYP.  CT A/P w/o contrast 06/13/20: IMPRESSION: 1. The appendix is not definitively visualized. There is no secondary evidence of appendicitis. 2. There is a dominant cyst/follicle in the right ovary measuring 2.8 cm in size. It is possible this is the source of the patient's pain. 3. Mild fecal loading in the ascending colon  Past Medical History:  Diagnosis Date   Asthma    Complication of anesthesia    GERD (gastroesophageal reflux disease)    Hypertension    Hypothyroidism    PONV (postoperative nausea and vomiting)    Seasonal allergies    Thyroid disease     Past Surgical History:  Procedure Laterality Date   CERVICAL CONE BIOPSY     CERVICAL CONIZATION W/BX N/A 06/28/2018   Procedure: CONIZATION CERVIX WITH BIOPSY;  Surgeon: Kirkland Hun, MD;  Location: [facility];  Service: Gynecology;  Laterality: N/A;   CHOLECYSTECTOMY     RIGHT/LEFT HEART CATH AND CORONARY ANGIOGRAPHY N/A 10/17/2017   Procedure: RIGHT/LEFT HEART CATH AND CORONARY ANGIOGRAPHY;  Surgeon: Marykay Lex, MD;  Location: [facility] INVASIVE CV LAB;  Service: Cardiovascular;:: Normal LVEDP.  Possibly 2+ MR with normal systolic function.  Normal right heart pressures. -->  Increased amlodipine to 10 mg daily   SHOULDER SURGERY  10/2021   labrum  tear   TRANSTHORACIC ECHOCARDIOGRAM  02/'19; 9/'20   EF 60-65%.  No RWMA.  Normal PA pressures.  Normal valves.  Essentially normal echo;; 9/'20:  EF 60 to 65%.  Normal wall motion.  Mild left atrial dilation suggestive of mild diastolic dysfunction (however parameters were normal.  Normal RV size and function.    TUBAL LIGATION     VAGINAL HYSTERECTOMY Right 08/09/2018   Procedure: HYSTERECTOMY VAGINAL WITH RIGHT SALPINGECTOMY;  Surgeon: Kirkland Hun, MD;  Location: [facility];  Service: Gynecology;  Laterality: Right;    Current Outpatient Medications  Medication Sig Dispense Refill   ASPIRIN 81 PO  ASPIRIN LOW EC 81MG  TAB, 0 Refill(s), 107.6     calcium carbonate (TUMS EX) 750 MG chewable tablet Chew 1 tablet by mouth daily as needed for heartburn.     levothyroxine (SYNTHROID) 175 MCG tablet Take 175 mcg by mouth daily before breakfast.     lisinopril-hydrochlorothiazide (ZESTORETIC) 20-12.5 MG tablet Take 2 tablets by mouth daily. 60 tablet 0   Multiple Vitamin (MULTIVITAMIN) tablet Take 1 tablet by mouth daily.     omeprazole (PRILOSEC) 40 MG capsule Take 1 capsule (40 mg total) by mouth 2 (two) times daily before a meal. 60 capsule 1   ondansetron (ZOFRAN) 8 MG tablet Take 1 tablet (8 mg total) by mouth every 8 (eight) hours as needed for nausea or vomiting. 20 tablet 0   phentermine (ADIPEX-P) 37.5 MG tablet Take 37.5 mg by mouth every morning.     valACYclovir (VALTREX) 500 MG tablet Take 2,000 mg by mouth daily as needed (for fever blisters).      No current facility-administered medications for this visit.    Allergies as of 10/12/2023 - Review Complete 01/18/2022  Allergen Reaction Noted   Iodinated contrast media Itching 05/05/2017   Codeine Hives, Itching, and Other (See Comments) 12/24/2011   Gadolinium derivatives Hives and Other (See Comments) 09/12/2016   Tape Swelling and Rash 05/22/2018    Family History  Problem Relation Age of Onset   Stroke Mother    Hypertension Mother    Hypothyroidism Mother    Heart attack Father 29   CAD Father        stents   Hypothyroidism Sister    Skin cancer Sister    Hypothyroidism Sister    Seizures Maternal Aunt    Aneurysm Maternal Aunt    Hypertension Maternal Grandmother    Arthritis Maternal Grandmother     Colon cancer Neg Hx    Esophageal cancer Neg Hx    Stomach cancer Neg Hx    Rectal cancer Neg Hx     Social History   Socioeconomic History   Marital status: Legally Separated    Spouse name: Not on file   Number of children: 3   Years of education: Not on file   Highest education level: Not on file  Occupational History   Not on file  Tobacco Use   Smoking status: Never   Smokeless tobacco: Never  Vaping Use   Vaping status: Never Used  Substance and Sexual Activity   Alcohol use: Yes    Comment: rarely   Drug use: No   Sexual activity: Not on file  Other Topics Concern   Not on file  Social History Narrative   She currently works at a [employer], building tires   She never smoked. Does not do routine exercise, but has recently started adjusting her diet.   Social Drivers of Health  Financial Resource Strain: Not on file  Food Insecurity: Not on file  Transportation Needs: Not on file  Physical Activity: Not on file  Stress: Not on file  Social Connections: Not on file  Intimate Partner Violence: Not on file    Review of Systems:    Constitutional: No weight loss, fever, chills, weakness or fatigue HEENT: Eyes: No change in vision               Ears, Nose, Throat:  No change in hearing or congestion Skin: No rash or itching Cardiovascular: No chest pain, chest pressure or palpitations   Respiratory: No SOB or cough Gastrointestinal: See HPI and otherwise negative Genitourinary: No dysuria or change in urinary frequency Neurological: No headache, dizziness or syncope Musculoskeletal: No new muscle or joint pain Hematologic: No bleeding or bruising Psychiatric: No history of depression or anxiety    Physical Exam:  Vital signs: LMP 08/08/2018   Constitutional: NAD, Well developed, Well nourished, alert and cooperative Head:  Normocephalic and atraumatic. Eyes:   PEERL, EOMI. No icterus. Conjunctiva pink. Respiratory: Respirations even and  unlabored. Lungs clear to auscultation bilaterally.   No wheezes, crackles, or rhonchi.  Cardiovascular:  Regular rate and rhythm. No peripheral edema, cyanosis or pallor.  Gastrointestinal:  Soft, nondistended, nontender. No rebound or guarding. Normal bowel sounds. No appreciable masses or hepatomegaly. Rectal:  Not performed.  Msk:  Symmetrical without gross deformities. Without edema, no deformity or joint abnormality.  Neurologic:  Alert and  oriented x4;  grossly normal neurologically.  Skin:   Dry and intact without significant lesions or rashes. Psychiatric: Oriented to person, place and time. Demonstrates good judgement and reason without abnormal affect or behaviors.  Physical Exam           RELEVANT LABS AND IMAGING: CBC    Component Value Date/Time   WBC 6.7 06/13/2020 0833   RBC 4.46 06/13/2020 0833   HGB 13.7 06/13/2020 0833   HGB 12.7 10/12/2017 1419   HCT 42.6 06/13/2020 0833   HCT 37.6 10/12/2017 1419   PLT 205 06/13/2020 0833   PLT 238 10/12/2017 1419   MCV 95.5 06/13/2020 0833   MCV 92 10/12/2017 1419   MCH 30.7 06/13/2020 0833   MCHC 32.2 06/13/2020 0833   RDW 12.1 06/13/2020 0833   RDW 13.3 10/12/2017 1419   LYMPHSABS 1.5 03/11/2019 2014   MONOABS 0.7 03/11/2019 2014   EOSABS 0.3 03/11/2019 2014   BASOSABS 0.0 03/11/2019 2014    CMP     Component Value Date/Time   NA 138 06/13/2020 0833   NA 141 10/12/2017 1419   K 3.8 06/13/2020 0833   CL 104 06/13/2020 0833   CO2 26 06/13/2020 0833   GLUCOSE 111 (H) 06/13/2020 0833   BUN 16 06/13/2020 0833   BUN 11 10/12/2017 1419   CREATININE 0.81 06/13/2020 0833   CALCIUM 8.8 (L) 06/13/2020 0833   PROT 6.5 06/13/2020 0833   ALBUMIN 3.7 06/13/2020 0833   AST 28 06/13/2020 0833   ALT 47 (H) 06/13/2020 0833   ALKPHOS 99 06/13/2020 0833   BILITOT 0.8 06/13/2020 0833   GFRNONAA >60 06/13/2020 0833   GFRAA >60 03/11/2019 2014     Assessment/Plan:   Assessment and Plan                Jamiria Langill  Jolee Ewing [facility] 10/11/2023, 1:03 PM  Cc: Assunta Found, MD

## 2023-10-12 ENCOUNTER — Ambulatory Visit: Payer: Self-pay | Admitting: Gastroenterology

## 2023-11-13 ENCOUNTER — Ambulatory Visit: Attending: Cardiology | Admitting: Cardiology

## 2023-11-13 ENCOUNTER — Encounter: Payer: Self-pay | Admitting: Cardiology

## 2023-11-13 VITALS — BP 122/82 | HR 82 | Ht 65.0 in | Wt 255.0 lb

## 2023-11-13 DIAGNOSIS — R0609 Other forms of dyspnea: Secondary | ICD-10-CM | POA: Diagnosis not present

## 2023-11-13 DIAGNOSIS — I517 Cardiomegaly: Secondary | ICD-10-CM | POA: Insufficient documentation

## 2023-11-13 DIAGNOSIS — I700 Atherosclerosis of aorta: Secondary | ICD-10-CM | POA: Insufficient documentation

## 2023-11-13 DIAGNOSIS — I10 Essential (primary) hypertension: Secondary | ICD-10-CM | POA: Diagnosis not present

## 2023-11-13 DIAGNOSIS — E782 Mixed hyperlipidemia: Secondary | ICD-10-CM | POA: Insufficient documentation

## 2023-11-13 MED ORDER — ROSUVASTATIN CALCIUM 20 MG PO TABS: 20.0000 mg | ORAL_TABLET | Freq: Every day | ORAL | 3 refills | Status: AC

## 2023-11-13 MED ORDER — LISINOPRIL-HYDROCHLOROTHIAZIDE 20-12.5 MG PO TABS: ORAL_TABLET | ORAL | 1 refills | Status: DC

## 2023-11-13 NOTE — Patient Instructions (Addendum)
 Medication Instructions:  CHANGE Lisinopril-hydrochlorothiazide take 0.5 to 1 tablet daily as tolerated   START Crestor 20 mg take one tablet by mouth daily   *If you need a refill on your cardiac medications before your next appointment, please call your pharmacy*   Lab Work IN 01/2024: Lipid panel  Lipoprotein A   If you have labs (blood work) drawn today and your tests are completely normal, you will receive your results only by: MyChart Message (if you have MyChart) OR A paper copy in the mail If you have any lab test that is abnormal or we need to change your treatment, we will call you to review the results.   Testing/Procedures: ECHO  Your physician has requested that you have an echocardiogram. Echocardiography is a painless test that uses sound waves to create images of your heart. It provides your doctor with information about the size and shape of your heart and how well your heart's chambers and valves are working. This procedure takes approximately one hour. There are no restrictions for this procedure. Please do NOT wear cologne, perfume, aftershave, or lotions (deodorant is allowed). Please arrive 15 minutes prior to your appointment time.  Please note: We ask at that you not bring children with you during ultrasound (echo/ vascular) testing. Due to room size and safety concerns, children are not allowed in the ultrasound rooms during exams. Our front office staff cannot provide observation of children in our lobby area while testing is being conducted. An adult accompanying a patient to their appointment will only be allowed in the ultrasound room at the discretion of the ultrasound technician under special circumstances. We apologize for any inconvenience.   EXERCISE NUCLEAR STRESS TEST  Your physician has requested that you have en exercise stress myoview. For further information please visit [email or web address] Please follow instruction sheet, as given.     Follow-Up: At [facility], you and your health needs are our priority.  As part of our continuing mission to provide you with exceptional heart care, we have created designated Provider Care Teams.  These Care Teams include your primary Cardiologist (physician) and Advanced Practice Providers (APPs -  Physician Assistants and Nurse Practitioners) who all work together to provide you with the care you need, when you need it.  We recommend signing up for the patient portal called "MyChart".  Sign up information is provided on this After Visit Summary.  MyChart is used to connect with patients for Virtual Visits (Telemedicine).  Patients are able to view lab/test results, encounter notes, upcoming appointments, etc.  Non-urgent messages can be sent to your provider as well.   To learn more about what you can do with MyChart, go to [email or web address].    Your next appointment:   4 month(s)   Provider:   Elder Negus, MD     Other Instructions   1st Floor: - Lobby - Registration  - Pharmacy  - Lab - Cafe  2nd Floor: - PV Lab - Diagnostic Testing (echo, CT, nuclear med)  3rd Floor: - Vacant  4th Floor: - TCTS (cardiothoracic surgery) - AFib Clinic - Structural Heart Clinic - Vascular Surgery  - Vascular Ultrasound  5th Floor: - HeartCare Cardiology (general and EP) - Clinical Pharmacy for coumadin, hypertension, lipid, weight-loss medications, and med management appointments    Valet parking services will be available as well.

## 2023-11-13 NOTE — Progress Notes (Signed)
 Cardiology Office Note:  .   Date:  11/13/2023  ID:  Tammie Powell, DOB [birth year], MRN [id number] PCP: Assunta Found, MD  [facility] Providers Cardiologist:  Truett Mainland, MD PCP: Assunta Found, MD  Chief Complaint  Patient presents with   cardiomegaly    Discussed the use of AI scribe software for clinical note transcription with the patient, who gave verbal consent to proceed.     History of Present Illness: Tammie Kitchen    Tammie Powell is a 53 yPowello. 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 She reports a recent episode of chest pain and shortness of breath after climbing stairs at work, which led to an ER visit. The patient describes the pain as being located under her left breast. The ER visit included an EKG, blood work, and a CT scan, which reportedly showed an enlarged heart and an inflamed abdomen. The patient has a strong family history of heart disease, with her father and uncle experiencing heart attacks in their late thirties. She has been off her blood pressure medication, lisinopril and hydrochlorothiazide, for a couple of weeks due to a lapsed prescription, but her blood pressure was normal at the time of the visit. She has not been on any cholesterol medication and does not smoke. She occasionally consumes alcohol.  I personally reviewed records from her recent ER evaluation.  Also reviewed coronary angiogram from 2019 that showed no significant disease, was noted to have possible coronary spasm.  Vitals:   11/13/23 1612  BP: 122/82  Pulse: 82  SpO2: 97%     ROS:  Review of Systems  Cardiovascular:  Positive for dyspnea on exertion. Negative for chest pain, leg swelling, palpitations and syncope.     Studies Reviewed: Tammie Kitchen        EKG 11/13/2023: Normal sinus  rhythm Possible Inferior infarct , age undetermined When compared with ECG of 11-Mar-2019 19:47,  Criteria for ange indeterminate inferior infarct are new    Independently interpreted 04/2023: Chol 239, TG 183, HDL 57, LDL 149 HbA1C 5Powell5% Hb 14 Cr 0Powell97 TSH 27 high  External CT abdomen pelvis with contrast 11/05/2023 [facility]): --Mildly increased mesenteric attenuation with slight increase in prominent subcentimeter mesenteric lymph nodes. Findings are nonspecific but can be seen in the setting of mesenteric panniculitis.   --Otherwise, no acute abnormality in the abdomen or pelvis.   --Small pericardial effusion.    Physical Exam:   Physical Exam Vitals and nursing note reviewed.  Constitutional:      General: She is not in acute distress. Neck:     Vascular: No JVD.  Cardiovascular:     Rate and Rhythm: Normal rate and regular rhythm.     Heart sounds: Normal heart sounds. No murmur heard. Pulmonary:     Effort: Pulmonary effort is normal.     Breath sounds: Normal breath sounds. No wheezing or rales.  Musculoskeletal:     Right lower leg: No edema.     Left lower leg: No edema.      VISIT DIAGNOSES:   ICD-10-CM   1. Primary hypertension  I10 EKG 12-Lead    2. Mixed hyperlipidemia  E78Powell2     3. Exertional dyspnea  [id number]     4. Aortic atherosclerosis (HCC)  I70Powell0     5. Cardiomegaly  I51Powell7  ASSESSMENT AND PLAN: .    Tammie Powell is a 53 yPowello. female with hypertension, hyperlipidemia, family history of early CAD, aortic atherosclerosis  Exertional dyspnea, aortic atherosclerosis: Aortic atherosclerosis was an incidental finding on CT scan.  Exertional dyspnea has resolved, occurred in the setting of hypertension.  Chest pain has not recurred since then.  That said, patient has strong risk factors with mixed hyper-lipidemia and strong family history of early CAD. Recommend echocardiogram and exercise nuclear stress test. In addition to heart healthy diet  and lifestyle, recommend starting Crestor 20 mg daily. Check lipid panel and lipoprotein a in 3 months.  Cardiomegaly: Incidental finding on CT scan and aorta. Check echocardiogram for further evaluation.  Hypertension: Blood pressure very well-controlled today, but generally elevated.  Patient is generally on lisinopril-hydrochlorothiazide 20-12Powell5 mg daily.  She has been out of this for a few days.  I will give her 30-day prescription with 1 refill, take half-1 tablet as tolerated by blood pressure.  Encourage hydration.  Patient has follow-up with PCP next month where this can be further addressed.  Informed Consent   Shared Decision Making/Informed Consent The risks [chest pain, shortness of breath, cardiac arrhythmias, dizziness, blood pressure fluctuations, myocardial infarction, stroke/transient ischemic attack, nausea, vomiting, allergic reaction, radiation exposure, metallic taste sensation and life-threatening complications (estimated to be 1 in 10,000)], benefits (risk stratification, diagnosing coronary artery disease, treatment guidance) and alternatives of a nuclear stress test were discussed in detail with Tammie Powell and she agrees to proceed.       No orders of the defined types were placed in this encounter.    F/u in 02/2024  Signed, Elder Negus, MD

## 2023-11-14 ENCOUNTER — Encounter (HOSPITAL_COMMUNITY): Payer: Self-pay

## 2023-11-21 ENCOUNTER — Ambulatory Visit (HOSPITAL_COMMUNITY): Attending: Internal Medicine

## 2023-11-21 DIAGNOSIS — R0609 Other forms of dyspnea: Secondary | ICD-10-CM | POA: Diagnosis present

## 2023-11-21 DIAGNOSIS — I700 Atherosclerosis of aorta: Secondary | ICD-10-CM | POA: Insufficient documentation

## 2023-11-21 MED ORDER — TECHNETIUM TC 99M TETROFOSMIN IV KIT
32.1000 | PACK | Freq: Once | INTRAVENOUS | Status: AC | PRN
  Administered 2023-11-21: 32.1 via INTRAVENOUS

## 2023-11-22 ENCOUNTER — Ambulatory Visit (HOSPITAL_COMMUNITY): Attending: Internal Medicine

## 2023-11-22 ENCOUNTER — Encounter: Payer: Self-pay | Admitting: Cardiology

## 2023-11-22 LAB — MYOCARDIAL PERFUSION IMAGING
Angina Index: 0
Duke Treadmill Score: 6
Estimated workload: 7
Exercise duration (min): 6 min
Exercise duration (sec): 0 s
LV dias vol: 68 mL (ref 46–106)
LV sys vol: 26 mL
MPHR: 168 {beats}/min
Nuc Stress EF: 61 %
Peak HR: 166 {beats}/min
Percent HR: 98 %
Rest HR: 72 {beats}/min
Rest Nuclear Isotope Dose: 32.8 mCi
SDS: 3
SRS: 0
SSS: 3
ST Depression (mm): 0 mm
Stress Nuclear Isotope Dose: 32.1 mCi
TID: 1.01

## 2023-11-22 MED ORDER — TECHNETIUM TC 99M TETROFOSMIN IV KIT
32.8000 | PACK | Freq: Once | INTRAVENOUS | Status: AC | PRN
  Administered 2023-11-22: 32.8 via INTRAVENOUS

## 2023-11-29 ENCOUNTER — Other Ambulatory Visit: Payer: Self-pay | Admitting: Radiology

## 2023-11-30 LAB — SURGICAL PATHOLOGY

## 2023-12-06 ENCOUNTER — Ambulatory Visit (HOSPITAL_COMMUNITY)

## 2023-12-20 ENCOUNTER — Ambulatory Visit (HOSPITAL_COMMUNITY)

## 2024-01-16 ENCOUNTER — Ambulatory Visit (HOSPITAL_COMMUNITY): Admission: RE | Admit: 2024-01-16 | Source: Ambulatory Visit | Attending: Cardiology | Admitting: Cardiology

## 2024-01-16 ENCOUNTER — Encounter (HOSPITAL_COMMUNITY): Payer: Self-pay | Admitting: Cardiology

## 2024-03-10 ENCOUNTER — Encounter: Payer: Self-pay | Admitting: Cardiology

## 2024-04-14 ENCOUNTER — Other Ambulatory Visit: Payer: Self-pay | Admitting: Cardiology
